# Patient Record
Sex: Male | Born: 1955 | Race: White | Hispanic: No | Marital: Married | Smoking: Former smoker
Health system: Southern US, Community
[De-identification: ages and names within clinical notes are randomized; demographics above are authoritative.]

## PROBLEM LIST (undated history)

## (undated) DIAGNOSIS — E785 Hyperlipidemia, unspecified: Secondary | ICD-10-CM

## (undated) DIAGNOSIS — S2239XA Fracture of one rib, unspecified side, initial encounter for closed fracture: Secondary | ICD-10-CM

## (undated) DIAGNOSIS — C801 Malignant (primary) neoplasm, unspecified: Secondary | ICD-10-CM

## (undated) DIAGNOSIS — K219 Gastro-esophageal reflux disease without esophagitis: Secondary | ICD-10-CM

## (undated) HISTORY — DX: Gastro-esophageal reflux disease without esophagitis: K21.9

## (undated) HISTORY — DX: Hyperlipidemia, unspecified: E78.5

## (undated) HISTORY — DX: Fracture of one rib, unspecified side, initial encounter for closed fracture: S22.39XA

---

## 1998-05-14 ENCOUNTER — Encounter: Admission: RE | Admit: 1998-05-14 | Discharge: 1998-05-14 | Payer: Self-pay | Admitting: Sports Medicine

## 2002-05-27 ENCOUNTER — Encounter: Admission: RE | Admit: 2002-05-27 | Discharge: 2002-05-27 | Payer: Self-pay | Admitting: Family Medicine

## 2002-05-27 ENCOUNTER — Encounter: Payer: Self-pay | Admitting: Family Medicine

## 2002-08-27 ENCOUNTER — Encounter: Admission: RE | Admit: 2002-08-27 | Discharge: 2002-08-27 | Payer: Self-pay | Admitting: Family Medicine

## 2002-08-27 ENCOUNTER — Encounter: Payer: Self-pay | Admitting: Family Medicine

## 2002-09-06 ENCOUNTER — Encounter: Admission: RE | Admit: 2002-09-06 | Discharge: 2002-09-06 | Payer: Self-pay | Admitting: Family Medicine

## 2002-09-06 ENCOUNTER — Encounter: Payer: Self-pay | Admitting: Family Medicine

## 2003-12-08 ENCOUNTER — Encounter: Admission: RE | Admit: 2003-12-08 | Discharge: 2003-12-08 | Payer: Self-pay | Admitting: Sports Medicine

## 2004-03-17 ENCOUNTER — Encounter: Admission: RE | Admit: 2004-03-17 | Discharge: 2004-03-17 | Payer: Self-pay | Admitting: Family Medicine

## 2004-04-17 HISTORY — PX: TOTAL KNEE ARTHROPLASTY: SHX125

## 2004-07-13 ENCOUNTER — Encounter: Admission: RE | Admit: 2004-07-13 | Discharge: 2004-07-13 | Payer: Self-pay | Admitting: Family Medicine

## 2004-08-03 ENCOUNTER — Ambulatory Visit (HOSPITAL_BASED_OUTPATIENT_CLINIC_OR_DEPARTMENT_OTHER): Admission: RE | Admit: 2004-08-03 | Discharge: 2004-08-03 | Payer: Self-pay | Admitting: Orthopedic Surgery

## 2006-04-17 LAB — HM COLONOSCOPY

## 2007-08-05 ENCOUNTER — Encounter: Admission: RE | Admit: 2007-08-05 | Discharge: 2007-08-05 | Payer: Self-pay | Admitting: Family Medicine

## 2010-09-02 NOTE — Op Note (Signed)
NAMEGARLEN, Martin Malone              ACCOUNT NO.:  [id number]   MEDICAL RECORD NO.:  [id number]          PATIENT TYPE:  AMB   LOCATION:  DSC                          FACILITY:  [facility]   PHYSICIAN:  Mila Homer. Sherlean Foot, M.D. DATE OF BIRTH:  11-27-1955   DATE OF PROCEDURE:  08/29/2004  DATE OF DISCHARGE:  08/03/2004                                 OPERATIVE REPORT   PREOPERATIVE DIAGNOSIS:  Left medial meniscal tear and arthritis.   POSTOPERATIVE DIAGNOSIS:  Left medial meniscal tear and arthritis.   OPERATION PERFORMED:  Left knee arthroscopy with partial medial meniscectomy  and chondroplasty.   SURGEON:  Mila Homer. Sherlean Foot, M.D.   ASSISTANT:  None.   ANESTHESIA:  Knee block, MAC.   INDICATIONS FOR PROCEDURE:  The patient is a 55 year old male with medial  meniscal tear and osteoarthritis of the knee.  Informed consent was  obtained.   DESCRIPTION OF PROCEDURE:  The patient was laid supine and after a knee  block, administered MAC anesthesia.  The left lower extremity was prepped  and draped in the usual sterile fashion.  Inferolateral and inferomedial  portals were created with a #11 blade, blunt trocar and cannula.  Diagnostic  arthroscopy revealed grade 2 and 3 changes in the patellofemoral joint with  fibrillation in the patellar cartilage.  A 4.2 Great White shaver was used  to perform a chondroplasty in the patellofemoral compartment.  The medial  compartment also showed some osteoarthritis and a chondroplasty was done  there.  The patient had a posterior horn medial meniscus tear which was  radial.  I used basket forceps and a Great White shaver to remove  approximately 20% of the posterior horn of the medial meniscus.  I then  visited the notch.  The ACL and PCL was normal.  I then went into the  lateral compartment and the lateral compartment was normal.  I then  irrigated the knee, closed with interrupted 4-0 nylon sutures, dressed with  Xeroform dressing sponges, sterile  Webril and an Ace wrap.   COMPLICATIONS:  None.   DRAINS:  None.      SDL/MEDQ  D:  08/29/2004  T:  08/29/2004  Job:  [id number]

## 2010-09-02 NOTE — Op Note (Signed)
Martin Malone, Martin Malone              ACCOUNT NO.:  [id number]   MEDICAL RECORD NO.:  [id number]          PATIENT TYPE:  AMB   LOCATION:  DSC                          FACILITY:  [facility]   PHYSICIAN:  Mila Homer. Sherlean Foot, M.D. DATE OF BIRTH:  [birth year]   DATE OF PROCEDURE:  08/03/2004  DATE OF DISCHARGE:                                 OPERATIVE REPORT   SURGEON:  Mila Homer. Sherlean Foot, M.D.   ASSISTANT:  None.   ANESTHESIA:  MAC.   PREOPERATIVE DIAGNOSIS:  Left knee medial meniscus tear and osteoarthritis.   POSTOPERATIVE DIAGNOSIS:  Left knee medial meniscus tear and osteoarthritis.   PROCEDURE:  Left knee arthroscopy with partial medial meniscectomy and  medial compartment chondroplasty.   INDICATIONS FOR PROCEDURE:  The patient is a 55 year old with mechanical  symptoms and MRI evidence of meniscus tear.   DESCRIPTION OF PROCEDURE:  The patient was laid supine and administered MAC  anesthesia.  The left lower extremity was prepped and draped in the usual  sterile fashion.  Inferolateral and inferomedial portals were created with a  #11 blade, blunt trocar, and cannula.  Diagnostic arthroscopy revealed grade  1 changes on the patella, 0 on the trochlea.  I then went into the lateral  compartment and there was no pathology.  I then went to the notch.  The ACL  and PCL appeared normal.  I then went into the medial compartment, there was  a 2 by 3 cm area of grade 3 chondromalacia, on the tibia, there was a 1 by 2  cm area of grade 4 chondromalacia with absolutely no cartilage.  There was  also as very complex posterior horn medial meniscus tear.  I used straight  and upbiting basket forceps as well as the 4.2 great white shaver to perform  a very aggressive posterior horn partial medial meniscectomy as well as  performing a chondroplasty on the arthritic areas.  I then lavaged the knee  and closed with interrupted 4-0 nylon sutures.  It was dressed with Adaptic,  4 by 4, sterile Webril,  and an Ace wrap.  Complications none.  Drains none.      SDL/MEDQ  D:  08/03/2004  T:  08/03/2004  Job:  [id number]

## 2011-08-02 ENCOUNTER — Telehealth: Payer: Self-pay | Admitting: Gastroenterology

## 2011-08-02 NOTE — Telephone Encounter (Signed)
The pt has an extensive history with Dr Randa Evens (last colon 2008).  I advised Dr Blair Heys office that the pt should schedule with the already established GI.  The nurse is going to let Dr Artis Flock know.

## 2011-11-21 ENCOUNTER — Encounter: Payer: Self-pay | Admitting: *Deleted

## 2011-11-28 ENCOUNTER — Encounter: Payer: Self-pay | Admitting: Cardiovascular Disease

## 2011-11-28 ENCOUNTER — Ambulatory Visit (INDEPENDENT_AMBULATORY_CARE_PROVIDER_SITE_OTHER): Payer: BC Managed Care – PPO | Admitting: Cardiovascular Disease

## 2011-11-28 VITALS — BP 118/82 | HR 72 | Ht 70.0 in | Wt 204.0 lb

## 2011-11-28 DIAGNOSIS — Z8249 Family history of ischemic heart disease and other diseases of the circulatory system: Secondary | ICD-10-CM

## 2011-11-28 DIAGNOSIS — R079 Chest pain, unspecified: Secondary | ICD-10-CM

## 2011-11-28 NOTE — Patient Instructions (Addendum)
Your physician has requested that you have an exercise tolerance test. For further information please visit [email or web address] Please also follow instruction sheet, as given.  Dr Excell Seltzer has recommended that you have a CT Calcium score performed.  Your physician wants you to follow-up in: 2 YEARS. You will receive a reminder letter in the mail two months in advance. If you don't receive a letter, please call our office to schedule the follow-up appointment.  Your physician recommends that you continue on your current medications as directed. Please refer to the Current Medication list given to you today.

## 2011-11-29 ENCOUNTER — Encounter: Payer: Self-pay | Admitting: Cardiovascular Disease

## 2011-11-29 DIAGNOSIS — R079 Chest pain, unspecified: Secondary | ICD-10-CM | POA: Insufficient documentation

## 2011-11-29 DIAGNOSIS — Z8249 Family history of ischemic heart disease and other diseases of the circulatory system: Secondary | ICD-10-CM | POA: Insufficient documentation

## 2011-11-29 NOTE — Assessment & Plan Note (Signed)
The patient has no evidence of ischemic heart disease or obvious atherosclerotic disease on exam. I think the main question is whether he has subclinical atherosclerosis, considering that both brothers had coronary events around his age. I have recommended an exercise treadmill tolerance test to rule out obstructive coronary disease, and a cardiac CT calcium score to evaluate for subclinical atherosclerosis. I think the primary intervention if he has a high calcium score would be initiation of a statin drug. I don't think that I would be inclined to start him empirically on a statin drug considering his favorable HDL cholesterol. Will use his coronary CT to further risk stratify him. If his studies are normal, I will plan on seeing him back in about 2 years for followup as I think it would be reasonable to continue a relationship considering his elevated risk of coronary disease over the next 10 years. I appreciate the opportunity to meet this nice gentleman.

## 2011-11-29 NOTE — Progress Notes (Signed)
HPI:  56 year old gentleman presenting for initial cardiac evaluation. The patient has been healthy, but he has noted a strong premature family history of coronary artery disease. Because of his family history, he was referred for cardiac evaluation.  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 He thinks that he walks about 5 miles per day. He feels well he has no symptoms with physical exertion. He specifically denies exertional chest pain or pressure. He has no dyspnea, orthopnea, PND, palpitations, edema, lightheadedness, or history of syncope. He has had episodic chest discomfort at rest that he relates to gastroesophageal reflux disease. This has responded well to over-the-counter medication.  The patient has a strong history in his family of coronary artery disease. He has a brother who was recently diagnosed with CAD and underwent coronary stenting. This occurred in his 90s. He has another brother who underwent multivessel coronary bypass surgery at about age 66. The patient has 4 sisters who have not had cardiac problems and his parents did not have cardiac problems.  He has no personal history of hypertension, diabetes, dyslipidemia, or chronic medical problems. He is a remote smoker and quit in 1979 with only a 4-pack-year history.  Recent labs demonstrated glucose of 89, uric acid 6.3, creatinine 1.1, potassium 4.3, AST 18, ALT 14, cholesterol 222, triglycerides 79, HDL 76, and LDL 130. His thyroid studies, PSA, and CBC are all within normal limits.  Outpatient Encounter Prescriptions as of 11/28/2011  Medication Sig Dispense Refill  . aspirin 81 MG tablet Take 81 mg by mouth daily.      . DUEXIS 800-26.6 MG TABS Take 1 tablet by mouth as needed.      . fish oil-omega-3 fatty acids 1000 MG capsule Take 1 g by mouth daily.      Marland Kitchen GREEN COFFEE BEAN PO Take 800 mg by mouth daily.      Glory Rosebush Cartilage 750 MG CAPS Take 1 capsule by mouth daily.         Review of patient's allergies indicates no known allergies.  Past Medical History  Diagnosis Date  . GERD (gastroesophageal reflux disease)     Past Surgical History  Procedure Date  . Total knee arthroplasty 2006    History   Social History  . Marital Status: Married    Spouse Name: N/A    Number of Children: 1  . Years of Education: N/A   Occupational History  . facility manager    Social History Main Topics  . Smoking status: Former Smoker    Quit date: 04/17/1977  . Smokeless tobacco: Not on file  . Alcohol Use: 1.2 oz/week    2 Glasses of wine per week  . Drug Use: Not on file  . Sexually Active: Not on file   Other Topics Concern  . Not on file   Social History Narrative  . No narrative on file    Family History  Problem Relation Age of Onset  . Dementia Mother   . Pancreatitis Father   . Coronary artery disease Brother     ROS: General: no fevers/chills/night sweats Eyes: no blurry vision, diplopia, or amaurosis ENT: no sore throat or hearing loss Resp: no cough, wheezing, or hemoptysis CV: no edema or palpitations GI: no abdominal pain, nausea, vomiting, diarrhea, or constipation. Positive for dyspepsia GU: no dysuria, frequency, or hematuria Skin: no rash Neuro: no headache, numbness, tingling, or weakness of extremities Musculoskeletal: Positive for chronic left  knee pain Heme: no bleeding, DVT, or easy bruising Endo: no polydipsia or polyuria  BP 118/82  Pulse 72  Ht 5\' 10"  (1.778 m)  Wt 92.534 kg (204 lb)  BMI 29.27 kg/m2  PHYSICAL EXAM: Pt is alert and oriented, WD, WN, in no distress. HEENT: normal Neck: JVP normal. Carotid upstrokes normal without bruits. No thyromegaly. Lungs: equal expansion, clear bilaterally CV: Apex is discrete and nondisplaced, RRR without murmur or gallop Abd: soft, NT, +BS, no bruit, no hepatosplenomegaly Back: no CVA tenderness Ext: no C/C/E        Femoral pulses 2+= without bruits        DP/PT  pulses intact and = Skin: warm and dry without rash Neuro: CNII-XII intact             Strength intact = bilaterally  EKG:  Normal sinus rhythm 72 beats per minute, within normal limits.  ASSESSMENT AND PLAN:

## 2011-12-05 ENCOUNTER — Ambulatory Visit (INDEPENDENT_AMBULATORY_CARE_PROVIDER_SITE_OTHER)
Admission: RE | Admit: 2011-12-05 | Discharge: 2011-12-05 | Disposition: A | Discharge status: Home / Self Care | Payer: Self-pay | Source: Ambulatory Visit | Attending: Cardiovascular Disease | Admitting: Cardiovascular Disease

## 2011-12-05 DIAGNOSIS — R079 Chest pain, unspecified: Secondary | ICD-10-CM

## 2011-12-13 ENCOUNTER — Encounter: Payer: Self-pay | Admitting: Nurse Practitioner

## 2011-12-13 ENCOUNTER — Ambulatory Visit (INDEPENDENT_AMBULATORY_CARE_PROVIDER_SITE_OTHER): Payer: BC Managed Care – PPO | Admitting: Nurse Practitioner

## 2011-12-13 DIAGNOSIS — E785 Hyperlipidemia, unspecified: Secondary | ICD-10-CM

## 2011-12-13 DIAGNOSIS — R079 Chest pain, unspecified: Secondary | ICD-10-CM

## 2011-12-13 MED ORDER — ATORVASTATIN CALCIUM 10 MG PO TABS: 10.0000 mg | ORAL_TABLET | Freq: Every day | ORAL | Status: DC

## 2011-12-13 NOTE — Procedures (Signed)
Exercise Treadmill Test  Pre-Exercise Testing Evaluation Rhythm: normal sinus  Rate: 61  PR:  .14 QRS:  .09  QT:  .38 QTc: .38     Test  Exercise Tolerance Test Ordering MD: Tonny Bollman, MD  Interpreting MD: Ward Givens , NP  Unique Test No: 1  Treadmill:  1  Indication for ETT: chest pain - rule out ischemia  Contraindication to ETT: No   Stress Modality: exercise - treadmill  Cardiac Imaging Performed: non   Protocol: standard Bruce - maximal  Max BP:  173/74  Max MPHR (bpm):  163 85% MPR (bpm):  139  MPHR obtained (bpm):  176 % MPHR obtained:  107%  Reached 85% MPHR (min:sec):  7:20 Total Exercise Time (min-sec):  10:13  Workload in METS:  12.0 Borg Scale: 17  Reason ETT Terminated:  dyspnea    ST Segment Analysis At Rest: normal ST segments - no evidence of significant ST depression With Exercise: no evidence of significant ST depression  Other Information Arrhythmia:  No Angina during ETT:  absent (0) Quality of ETT:  diagnostic  ETT Interpretation:  normal - no evidence of ischemia by ST analysis  Comments: Good exercise tolerance.  No chest pain.  No acute st/t changes.  Recommendations: Pt will f/u with Dr. Excell Seltzer in 2 yrs as planned.  I have e-Rx lipitor 10mg  qhs and arranged for f/u lipids/lft's in 8 wks in our office.

## 2011-12-13 NOTE — Patient Instructions (Addendum)
Your physician recommends that you return for lab work in: 8 weeks (lipid and liver profile) Your physician has recommended you make the following change in your medication: START Lipitor 10 mg at night

## 2011-12-13 NOTE — Addendum Note (Signed)
Addended by: Reine Just on: 12/13/2011 04:15 PM   Modules accepted: Orders

## 2012-01-08 ENCOUNTER — Telehealth: Payer: Self-pay | Admitting: Cardiovascular Disease

## 2012-01-08 MED ORDER — ATORVASTATIN CALCIUM 10 MG PO TABS: 10.0000 mg | ORAL_TABLET | Freq: Every day | ORAL | Status: DC

## 2012-01-08 NOTE — Telephone Encounter (Signed)
Pt needs 90 days supply of lipitor [facility] [address]

## 2012-01-10 ENCOUNTER — Other Ambulatory Visit: Payer: Self-pay | Admitting: Cardiovascular Disease

## 2012-01-10 ENCOUNTER — Other Ambulatory Visit: Payer: Self-pay | Admitting: Cardiology

## 2012-01-10 MED ORDER — ATORVASTATIN CALCIUM 10 MG PO TABS: 10.0000 mg | ORAL_TABLET | Freq: Every day | ORAL | Status: DC

## 2012-01-10 NOTE — Telephone Encounter (Signed)
Called in 90 day supply of atorvastatin (LIPITOR) 10 MG 1 tablet qd at bed time with 1 refill [facility] [address], [place] phone # [phone] to [facility] the pharmacist. Caralee Ates, CMA  Called Martin Malone back informing him I gave the Rx to [facility] the pharmacist, and his meditation should be ready by this evening.  Caralee Ates, CMA

## 2012-02-08 ENCOUNTER — Other Ambulatory Visit (INDEPENDENT_AMBULATORY_CARE_PROVIDER_SITE_OTHER): Payer: BC Managed Care – PPO

## 2012-02-08 DIAGNOSIS — E785 Hyperlipidemia, unspecified: Secondary | ICD-10-CM

## 2012-02-08 LAB — HEPATIC FUNCTION PANEL
ALT: 27 U/L (ref 0–53)
AST: 23 U/L (ref 0–37)
Albumin: 3.6 g/dL (ref 3.5–5.2)
Alkaline Phosphatase: 47 U/L (ref 39–117)
Bilirubin, Direct: 0.1 mg/dL (ref 0.0–0.3)
Total Bilirubin: 0.9 mg/dL (ref 0.3–1.2)
Total Protein: 7.5 g/dL (ref 6.0–8.3)

## 2012-02-08 LAB — LIPID PANEL
Cholesterol: 167 mg/dL (ref 0–200)
HDL: 63 mg/dL (ref >39.00)
LDL Cholesterol: 93 mg/dL (ref 0–99)
Total CHOL/HDL Ratio: 3
Triglycerides: 57 mg/dL (ref 0.0–149.0)
VLDL: 11.4 mg/dL (ref 0.0–40.0)

## 2012-02-15 ENCOUNTER — Telehealth: Payer: Self-pay | Admitting: Cardiovascular Disease

## 2012-02-15 NOTE — Telephone Encounter (Signed)
Reviewed results of lipid and liver with pt who was very excited about his numbers.

## 2012-02-15 NOTE — Telephone Encounter (Signed)
lmtcb

## 2012-02-15 NOTE — Telephone Encounter (Signed)
New problem:  Test results.  

## 2012-04-22 ENCOUNTER — Ambulatory Visit: Payer: BC Managed Care – PPO | Admitting: Family Medicine

## 2012-05-22 ENCOUNTER — Encounter: Payer: Self-pay | Admitting: Family Medicine

## 2012-05-22 ENCOUNTER — Ambulatory Visit (INDEPENDENT_AMBULATORY_CARE_PROVIDER_SITE_OTHER): Payer: BC Managed Care – PPO | Admitting: Family Medicine

## 2012-05-22 VITALS — BP 128/84 | HR 62 | Temp 98.1°F | Ht 70.75 in | Wt 210.2 lb

## 2012-05-22 DIAGNOSIS — Z8249 Family history of ischemic heart disease and other diseases of the circulatory system: Secondary | ICD-10-CM

## 2012-05-22 DIAGNOSIS — E785 Hyperlipidemia, unspecified: Secondary | ICD-10-CM

## 2012-05-22 NOTE — Patient Instructions (Addendum)
Schedule your complete physical for this summer Keep up the good work!  You look great! Call with any questions or concerns Welcome!  We're glad to have you!!!

## 2012-05-22 NOTE — Progress Notes (Signed)
  Subjective:    Patient ID: Martin Malone, male    DOB: [birth year], 57 y.o.   MRN: [id number]  HPI New to establish.  Previous MD- Elisabeth Cara, Last CPE 6/13, UTD on colonscopy.  Hyperlipidemia- chronic problem, on Lipitor.  Temporarily off meds due to insurance switch.  Waiting on new cards.  No abd pain, N/V, myalgias.  Exercising regularly.  Family hx of CAD- brother required angioplasty, older brother w/ triple bypass.  Saw Dr Excell Seltzer and had complete cardiac w/u w/ good results.  Only recommendation was start statin.   Review of Systems For ROS see HPI     Objective:   Physical Exam  Vitals reviewed. Constitutional: He is oriented to person, place, and time. He appears well-developed and well-nourished. No distress.  HENT:  Head: Normocephalic and atraumatic.  Eyes: Conjunctivae and EOM are normal. Pupils are equal, round, and reactive to light.  Neck: Normal range of motion. Neck supple. No thyromegaly present.  Cardiovascular: Normal rate, regular rhythm, normal heart sounds and intact distal pulses.   No murmur heard. Pulmonary/Chest: Effort normal and breath sounds normal. No respiratory distress.  Abdominal: Soft. Bowel sounds are normal. He exhibits no distension.  Musculoskeletal: He exhibits no edema.  Lymphadenopathy:    He has no cervical adenopathy.  Neurological: He is alert and oriented to person, place, and time. No cranial nerve deficit.  Skin: Skin is warm and dry.  Psychiatric: He has a normal mood and affect. His behavior is normal.          Assessment & Plan:

## 2012-05-26 NOTE — Assessment & Plan Note (Signed)
New to provider.  LDL goal is 70-100 due to family hx.  Pt plans to resume statin as soon as insurance situation is resolved.  Reviewed recent labs.  Will repeat at upcoming CPE.

## 2012-05-26 NOTE — Assessment & Plan Note (Signed)
New to provider.  Had recent cardiac workup w/ Dr Excell Seltzer.  Will continue to focus on risk reduction.

## 2012-08-23 ENCOUNTER — Other Ambulatory Visit: Payer: Self-pay | Admitting: Cardiovascular Disease

## 2012-10-21 ENCOUNTER — Ambulatory Visit: Payer: BC Managed Care – PPO | Admitting: Family Medicine

## 2012-11-19 ENCOUNTER — Encounter: Payer: Self-pay | Admitting: Family Medicine

## 2012-11-19 ENCOUNTER — Ambulatory Visit (INDEPENDENT_AMBULATORY_CARE_PROVIDER_SITE_OTHER): Payer: BC Managed Care – PPO | Admitting: Family Medicine

## 2012-11-19 VITALS — BP 130/90 | HR 62 | Temp 98.3°F | Ht 70.75 in | Wt 201.8 lb

## 2012-11-19 DIAGNOSIS — Z1331 Encounter for screening for depression: Secondary | ICD-10-CM

## 2012-11-19 DIAGNOSIS — Z0000 Encounter for general adult medical examination without abnormal findings: Secondary | ICD-10-CM | POA: Insufficient documentation

## 2012-11-19 LAB — CBC WITH DIFFERENTIAL/PLATELET
Basophils Absolute: 0 10*3/uL (ref 0.0–0.1)
Basophils Relative: 0.5 % (ref 0.0–3.0)
Eosinophils Absolute: 0.3 10*3/uL (ref 0.0–0.7)
Eosinophils Relative: 3.5 % (ref 0.0–5.0)
HCT: 42.5 % (ref 39.0–52.0)
Hemoglobin: 13.9 g/dL (ref 13.0–17.0)
Lymphocytes Relative: 25.8 % (ref 12.0–46.0)
Lymphs Abs: 2.1 10*3/uL (ref 0.7–4.0)
MCHC: 32.8 g/dL (ref 30.0–36.0)
MCV: 97.6 fl (ref 78.0–100.0)
Monocytes Absolute: 0.5 10*3/uL (ref 0.1–1.0)
Monocytes Relative: 5.6 % (ref 3.0–12.0)
Neutro Abs: 5.3 10*3/uL (ref 1.4–7.7)
Neutrophils Relative %: 64.6 % (ref 43.0–77.0)
Platelets: 239 10*3/uL (ref 150.0–400.0)
RBC: 4.36 Mil/uL (ref 4.22–5.81)
RDW: 14.2 % (ref 11.5–14.6)
WBC: 8.2 10*3/uL (ref 4.5–10.5)

## 2012-11-19 LAB — HEPATIC FUNCTION PANEL
ALT: 26 U/L (ref 0–53)
AST: 25 U/L (ref 0–37)
Albumin: 4.2 g/dL (ref 3.5–5.2)
Alkaline Phosphatase: 65 U/L (ref 39–117)
Bilirubin, Direct: 0.1 mg/dL (ref 0.0–0.3)
Total Bilirubin: 0.8 mg/dL (ref 0.3–1.2)
Total Protein: 7.1 g/dL (ref 6.0–8.3)

## 2012-11-19 LAB — BASIC METABOLIC PANEL
BUN: 15 mg/dL (ref 6–23)
CO2: 28 mEq/L (ref 19–32)
Calcium: 9.5 mg/dL (ref 8.4–10.5)
Chloride: 106 mEq/L (ref 96–112)
Creatinine, Ser: 1 mg/dL (ref 0.4–1.5)
GFR: 85.49 mL/min (ref >60.00)
Glucose, Bld: 101 mg/dL — ABNORMAL HIGH (ref 70–99)
Potassium: 4.1 mEq/L (ref 3.5–5.1)
Sodium: 139 mEq/L (ref 135–145)

## 2012-11-19 LAB — LIPID PANEL
Cholesterol: 201 mg/dL — ABNORMAL HIGH (ref 0–200)
HDL: 89.4 mg/dL (ref >39.00)
Total CHOL/HDL Ratio: 2
Triglycerides: 81 mg/dL (ref 0.0–149.0)
VLDL: 16.2 mg/dL (ref 0.0–40.0)

## 2012-11-19 LAB — PSA: PSA: 1.13 ng/mL (ref 0.10–4.00)

## 2012-11-19 LAB — TSH: TSH: 2.11 u[IU]/mL (ref 0.35–5.50)

## 2012-11-19 NOTE — Assessment & Plan Note (Signed)
Pt's PE WNL.  UTD on colonoscopy.  Check labs.  Anticipatory guidance provided.  

## 2012-11-19 NOTE — Patient Instructions (Addendum)
Follow up in 6 months to recheck lipids Keep up the good work!  You look great! We'll notify you of your lab results and make any changes if needed Call with any questions or concerns Enjoy the rest of summer!!

## 2012-11-19 NOTE — Progress Notes (Signed)
  Subjective:    Patient ID: Martin Malone, male    DOB: [birth year], 57 y.o.   MRN: [id number]  HPI CPE- pt had rib fx after falling while fishing 10/19/12.  +LOC.  Pt has been having positional dizziness.  Feels that in past 5 days this has improved 'a great deal'.  Rib pain is also improving.   Review of Systems Patient reports no vision/hearing changes, anorexia, fever ,adenopathy, persistant/recurrent hoarseness, swallowing issues, chest pain, palpitations, edema, persistant/recurrent cough, hemoptysis, dyspnea (rest,exertional, paroxysmal nocturnal), gastrointestinal  bleeding (melena, rectal bleeding), abdominal pain, excessive heart burn, GU symptoms (dysuria, hematuria, voiding/incontinence issues) syncope, focal weakness, memory loss, numbness & tingling, skin/hair/nail changes, depression, anxiety, abnormal bruising/bleeding, musculoskeletal symptoms/signs.     Objective:   Physical Exam BP 130/90  Pulse 62  Temp(Src) 98.3 F (36.8 C) (Oral)  Ht 5' 10.75" (1.797 m)  Wt 201 lb 12.8 oz (91.536 kg)  BMI 28.35 kg/m2  SpO2 97%  General Appearance:    Alert, cooperative, no distress, appears stated age  Head:    Normocephalic, without obvious abnormality, atraumatic  Eyes:    PERRL, conjunctiva/corneas clear, EOM's intact, fundi    benign, both eyes       Ears:    Normal TM's and external ear canals, both ears  Nose:   Nares normal, septum midline, mucosa normal, no drainage   or sinus tenderness  Throat:   Lips, mucosa, and tongue normal; teeth and gums normal  Neck:   Supple, symmetrical, trachea midline, no adenopathy;       thyroid:  No enlargement/tenderness/nodules  Back:     Symmetric, no curvature, ROM normal, no CVA tenderness  Lungs:     Clear to auscultation bilaterally, respirations unlabored  Chest wall:    No tenderness or deformity  Heart:    Regular rate and rhythm, S1 and S2 normal, no murmur, rub   or gallop  Abdomen:     Soft, non-tender, bowel sounds  active all four quadrants,    no masses, no organomegaly  Genitalia:    Normal male without lesion, discharge or tenderness  Rectal:    Normal tone, normal prostate, no masses or tenderness  Extremities:   Extremities normal, atraumatic, no cyanosis or edema  Pulses:   2+ and symmetric all extremities  Skin:   Skin color, texture, turgor normal, no rashes or lesions  Lymph nodes:   Cervical, supraclavicular, and axillary nodes normal  Neurologic:   CNII-XII intact. Normal strength, sensation and reflexes      throughout          Assessment & Plan:

## 2012-11-20 LAB — LDL CHOLESTEROL, DIRECT: Direct LDL: 95.1 mg/dL

## 2012-11-27 NOTE — Progress Notes (Signed)
Spoke with pt, he has already seen results via My Chart. He states he does not eat fried food or foods high in cholesterol. He added that he also walks 4-5 miles 5 days a week. Advised to repeat labs in 6 months to see if there is an improvement other wise encouraged him to continue with low fat diet and continue getting as much exercise as possible.

## 2012-12-22 IMAGING — CT CT HEART SCORING
2 series · 16 of 20 positions shown, 18 images · non-contrast
Comparison: none

***ADDENDUM*** CREATED: 12/05/2011 [DATE]

CARDIAC CTA WITH CALCIUM SCORE 12/05/2011 [DATE]
Ordering Physician: LOCKLEAR
Gong Physician: Edwin Francisco.Hiraki
PROTOCOL: The patient scanned on a Siemens sensations 16 slice
scanner.  After an initial AP and lateral topogram, 3 mm axial
slices were performed through the heart for calcium scoring.
Indications: CAD risk
DETAILED FINDINGS:
Quality of Study: Good
Coronary Calcium Score: 99 Agatston units with calcium noted in the
area of the distal left main/proximal LAD
INDICATION: Risk Factor Stratification
PROTOCOL: The patient was scanned on a Siemens Sensation 16 slice
scanner.  Noncontrast axial slices were done through the heart.
The images were analyzed on a Philips work station  Calcium scoring
was done using the Agatson Method

[Series 3: calcium score · axial · 0.40mm/px · z∈[-240,-156]mm · 8 of 38 slices shown, 10 images]
[im 5/38  vessel]
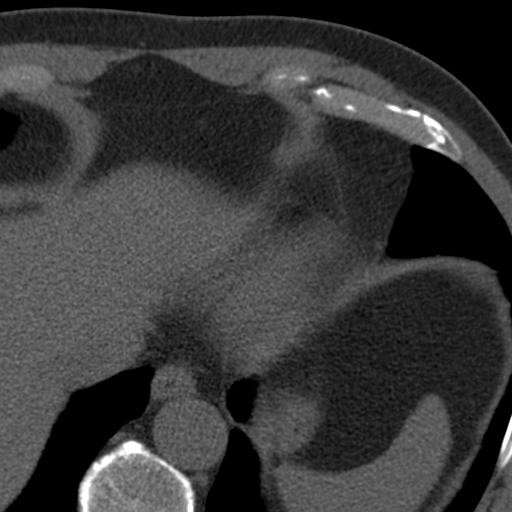
[im 5/38  lung]
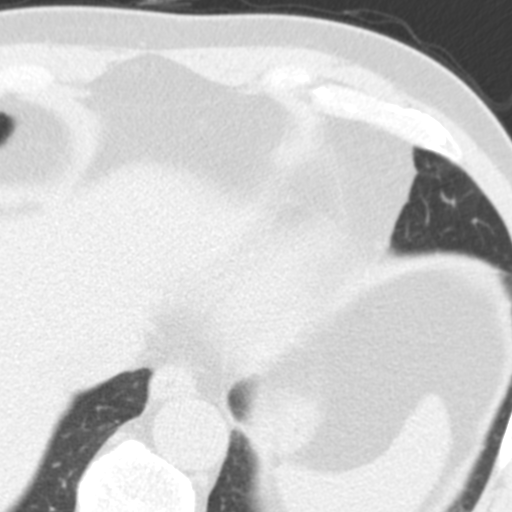
[im 9/38  vessel]
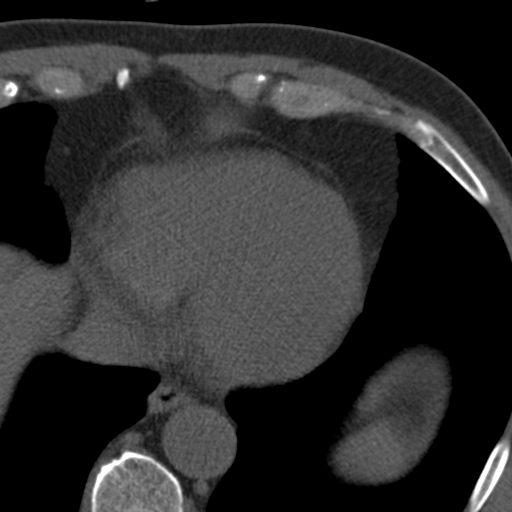
[im 13/38  vessel]
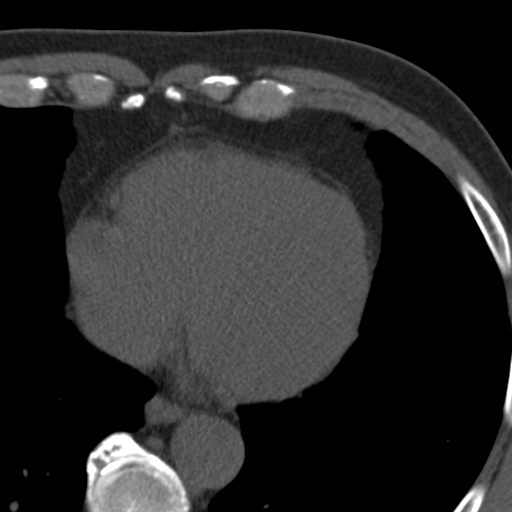
[im 17/38  vessel]
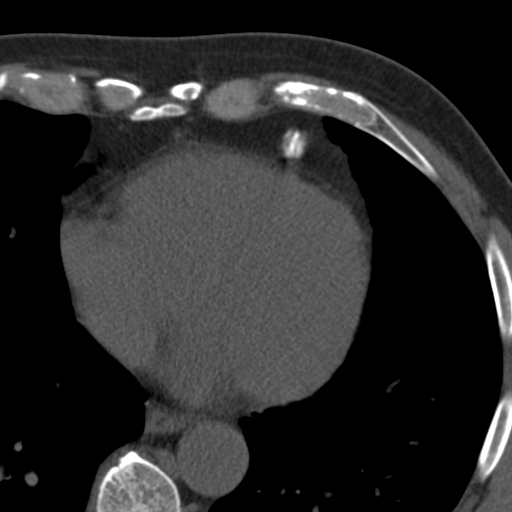
[im 21/38  vessel]
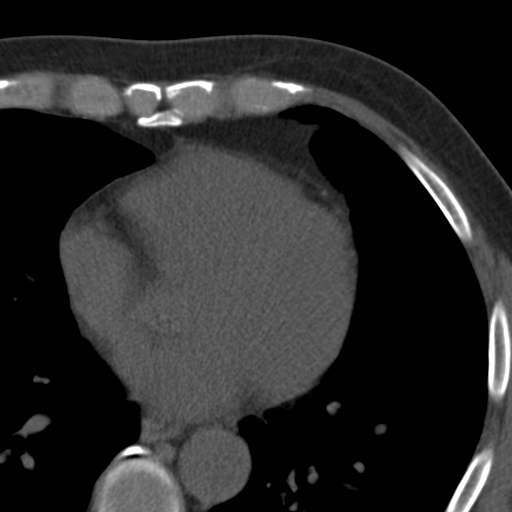
[im 21/38  lung]
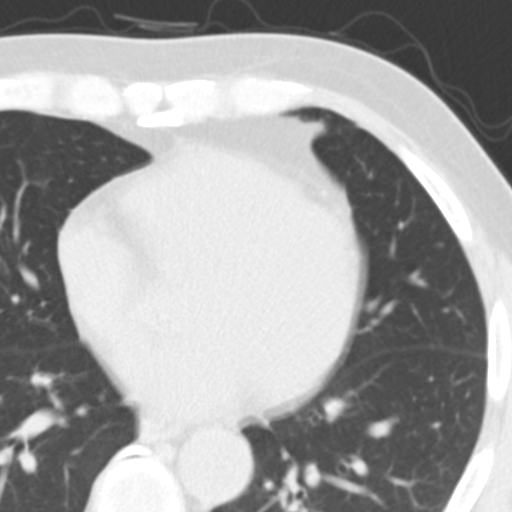
[im 25/38  vessel]
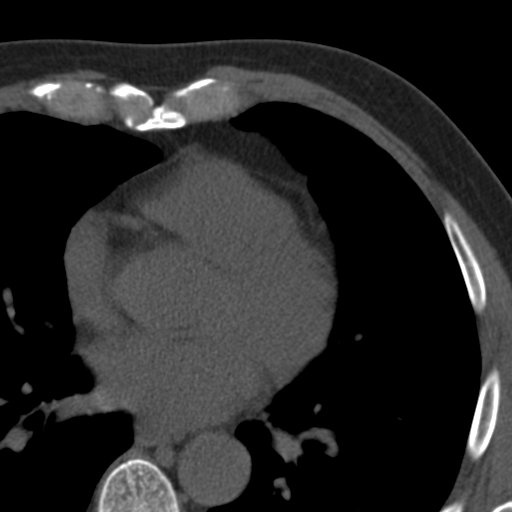
[im 29/38  vessel]
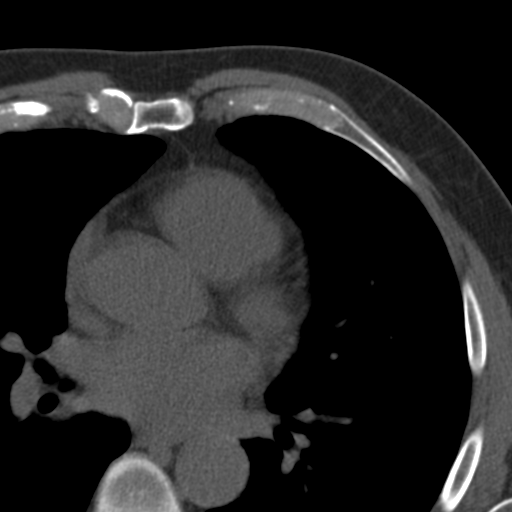
[im 33/38  vessel]
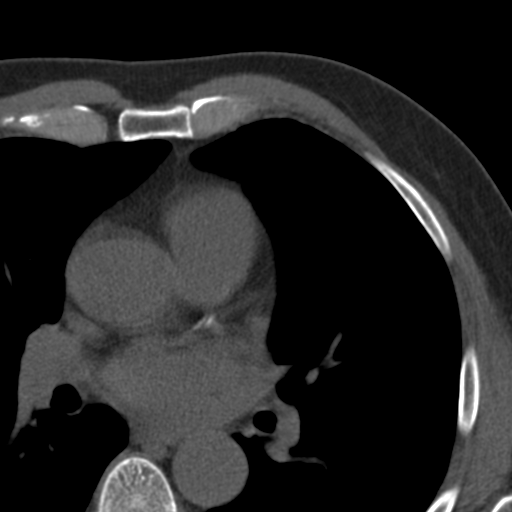

[Series 5: thins st · axial · 0.72mm/px · z∈[-236,-152]mm · 8 of 37 slices shown]
[im 5/37  vessel]
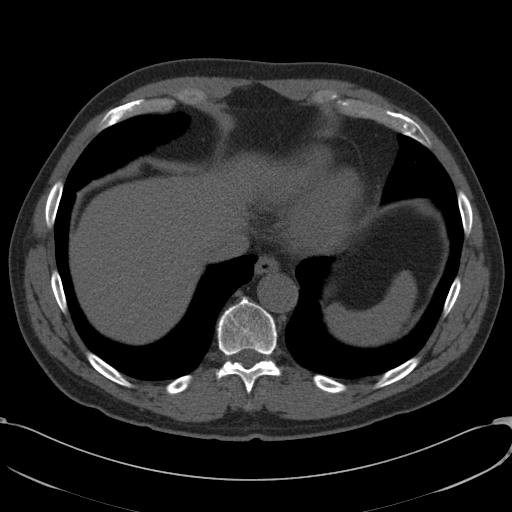
[im 9/37  vessel]
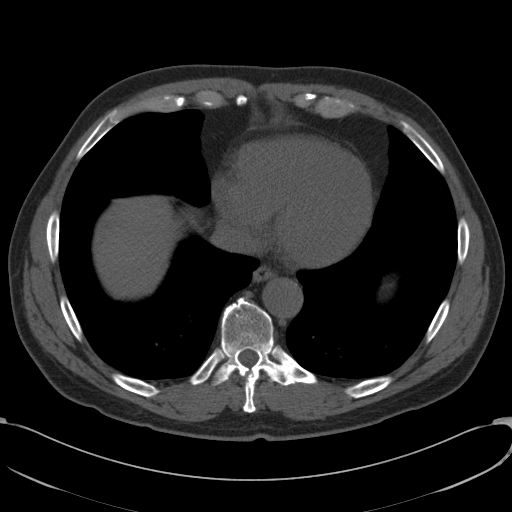
[im 13/37  vessel]
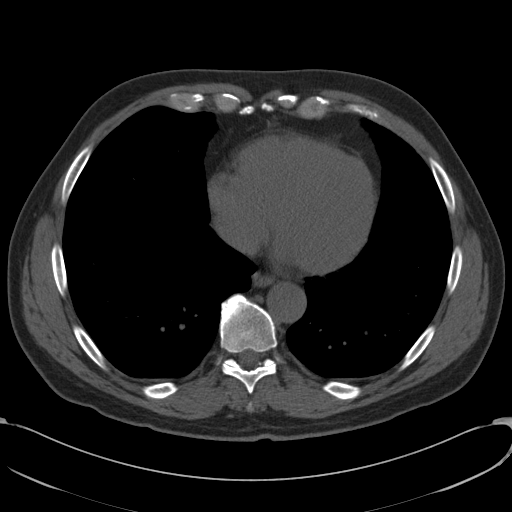
[im 17/37  vessel]
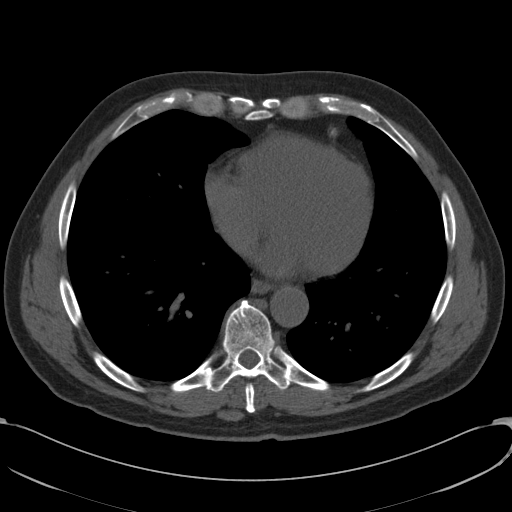
[im 21/37  vessel]
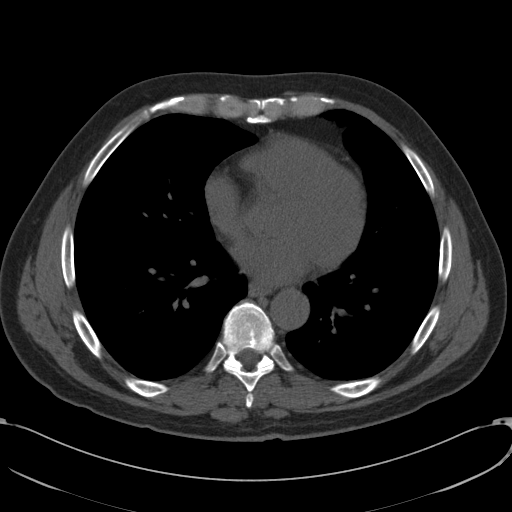
[im 25/37  vessel]
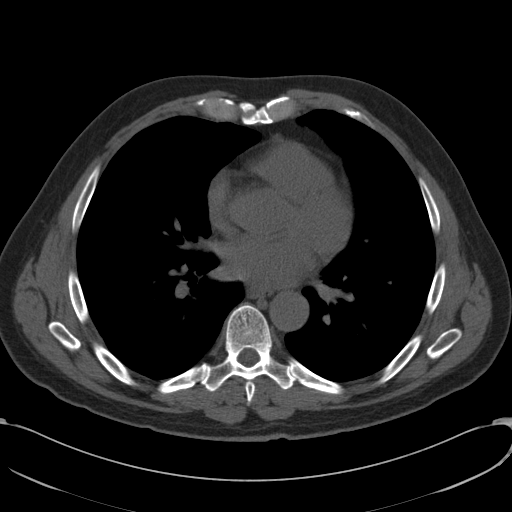
[im 29/37  vessel]
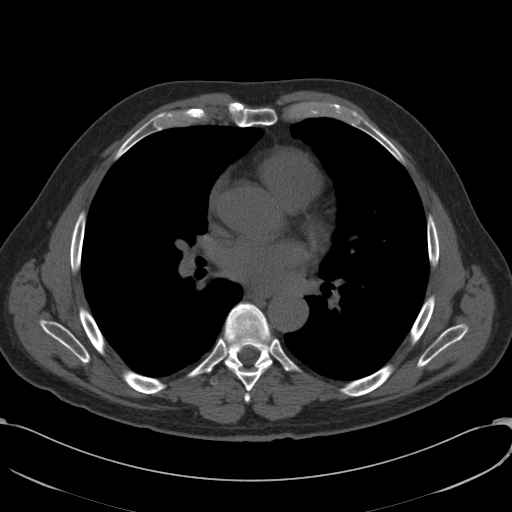
[im 33/37  vessel]
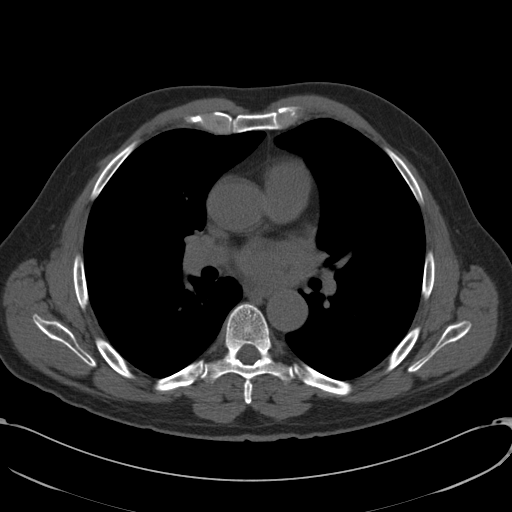

[16 of 20 positions shown; findings below may reference images not displayed]

IMPRESSION: Coronary artery calcium score of 99 Agatston units places the
patient in the 67th percentile for age and gender.  This places him
at intermediate risk of future cardiovascular events.

***END ADDENDUM*** SIGNED BY: Thoail Piumal
OVER-READ INTERPRETATION - CT CHEST

The following report is an over-read performed by radiologist Dr.
[DATE].  This over-read does not include interpretation of
cardiac or coronary anatomy or pathology.  The coronary calcium
score interpretation by the cardiologist is attached.
FINDINGS: Lung windows demonstrate no imaged airspace opacities.

Soft tissue windows demonstrate calcific density along the anterior
pericardium measures 1.1 cm and  is favored to be a lymph node
secondary to old granulomatous disease.  No pleural fluid.  No
imaged thoracic adenopathy.

Limited abdominal imaging demonstrates no significant findings.
Moderate mid thoracic spondylosis.

IMPRESSION
No acute or significant extracardiac findings within the imaged
chest.

Cardiac Calcium Score:
FINDINGS: There was an area of dense calcification involving the proximal
LAD.  Total calcium score was 99.4.  There was no calcium in the
circumflex or right coronary artery.  The pericardium was normal
The ascending aorta was upper limits of normal at 3.8 cm.  There as
an area of calcification in the epicardial fat near the RV apex.
IMPRESSION: 1)    Calcium Score 99.4  which is 66th percentile for age and sex
      matched controls All of the calcium in the proximal LAD

2)    Ascending aorta upper limits of normal 3.8 cm
3)    Calcification of the epicardial fat near the RV apex
4)    Normal pericardium

## 2013-02-20 ENCOUNTER — Other Ambulatory Visit: Payer: Self-pay

## 2013-03-01 ENCOUNTER — Other Ambulatory Visit: Payer: Self-pay | Admitting: Cardiovascular Disease

## 2013-05-21 ENCOUNTER — Encounter: Payer: Self-pay | Admitting: General Practice

## 2013-05-22 ENCOUNTER — Ambulatory Visit: Payer: BC Managed Care – PPO | Admitting: Family Medicine

## 2013-05-26 ENCOUNTER — Ambulatory Visit: Payer: BC Managed Care – PPO | Admitting: Family Medicine

## 2013-05-28 ENCOUNTER — Encounter: Payer: Self-pay | Admitting: Family Medicine

## 2013-05-28 ENCOUNTER — Ambulatory Visit (INDEPENDENT_AMBULATORY_CARE_PROVIDER_SITE_OTHER): Payer: BC Managed Care – PPO | Admitting: Family Medicine

## 2013-05-28 VITALS — BP 130/88 | HR 70 | Temp 98.2°F | Resp 16 | Wt 188.4 lb

## 2013-05-28 DIAGNOSIS — E785 Hyperlipidemia, unspecified: Secondary | ICD-10-CM

## 2013-05-28 DIAGNOSIS — Z2911 Encounter for prophylactic immunotherapy for respiratory syncytial virus (RSV): Secondary | ICD-10-CM

## 2013-05-28 DIAGNOSIS — Z23 Encounter for immunization: Secondary | ICD-10-CM

## 2013-05-28 DIAGNOSIS — H8110 Benign paroxysmal vertigo, unspecified ear: Secondary | ICD-10-CM | POA: Insufficient documentation

## 2013-05-28 LAB — HEPATIC FUNCTION PANEL
ALT: 28 U/L (ref 0–53)
AST: 31 U/L (ref 0–37)
Albumin: 4.4 g/dL (ref 3.5–5.2)
Alkaline Phosphatase: 44 U/L (ref 39–117)
Bilirubin, Direct: 0 mg/dL (ref 0.0–0.3)
Total Bilirubin: 0.8 mg/dL (ref 0.3–1.2)
Total Protein: 7.5 g/dL (ref 6.0–8.3)

## 2013-05-28 LAB — LIPID PANEL
Cholesterol: 190 mg/dL (ref 0–200)
HDL: 83.3 mg/dL (ref >39.00)
LDL Cholesterol: 93 mg/dL (ref 0–99)
Total CHOL/HDL Ratio: 2
Triglycerides: 67 mg/dL (ref 0.0–149.0)
VLDL: 13.4 mg/dL (ref 0.0–40.0)

## 2013-05-28 MED ORDER — MECLIZINE HCL 50 MG PO TABS: 50.0000 mg | ORAL_TABLET | Freq: Three times a day (TID) | ORAL | Status: DC | PRN

## 2013-05-28 NOTE — Assessment & Plan Note (Signed)
Chronic problem.  Check labs.  Adjust meds prn  

## 2013-05-28 NOTE — Patient Instructions (Signed)
Schedule your complete physical in 6 months We'll notify you of your lab results and make any changes if needed Keep up the good work! Continue the Aspirin daily Take the Meclizine as needed for the dizziness Try the exercises on the handout to desensitize your ears Call with any questions or concerns Happy Valentine's Day!

## 2013-05-28 NOTE — Assessment & Plan Note (Signed)
New.  Discussed dx and tx w/ pt.  Start meclizine prn.  Pt given handout for modified Eppley maneuver.  Reviewed supportive care and red flags that should prompt return.  Pt expressed understanding and is in agreement w/ plan.

## 2013-05-28 NOTE — Progress Notes (Signed)
   Subjective:    Patient ID: Martin Malone, male    DOB: 12/02/1955, 58 y.o.   MRN: [id number]  HPI Hyperlipidemia- on Lipitor.  Denies abd pain, N/V, myalgias, CP, SOB, HAs.  Dizziness- occuring intermittently.  Completely resolved for some time but then returned 2 weeks ago.  Worst w/ lying down and rolling over.  Will also have sxs w/ looking up for any period of time and then bringing head down to normal position.  sxs when lying are described as a spinning.  When standing, described as a 'woozy'.   Review of Systems For ROS see HPI     Objective:   Physical Exam  Vitals reviewed. Constitutional: He is oriented to person, place, and time. He appears well-developed and well-nourished. No distress.  HENT:  Head: Normocephalic and atraumatic.  Nose: Nose normal.  Mouth/Throat: Oropharynx is clear and moist.  TMs WNL bilaterally  Eyes: Conjunctivae and EOM are normal. Pupils are equal, round, and reactive to light.  2 beats of vertical nystagmus w/ upward gaze  Neck: Normal range of motion. Neck supple. No thyromegaly present.  Cardiovascular: Normal rate, regular rhythm, normal heart sounds and intact distal pulses.   No murmur heard. Pulmonary/Chest: Effort normal and breath sounds normal. No respiratory distress.  Abdominal: Soft. Bowel sounds are normal. He exhibits no distension.  Musculoskeletal: He exhibits no edema.  Lymphadenopathy:    He has no cervical adenopathy.  Neurological: He is alert and oriented to person, place, and time. No cranial nerve deficit.  Skin: Skin is warm and dry.  Psychiatric: He has a normal mood and affect. His behavior is normal.          Assessment & Plan:

## 2013-05-28 NOTE — Progress Notes (Signed)
Pre visit review using our clinic review tool, if applicable. No additional management support is needed unless otherwise documented below in the visit note. 

## 2013-05-28 NOTE — Addendum Note (Signed)
Addended by: Kris Hartmann on: 05/28/2013 02:51 PM   Modules accepted: Orders

## 2013-06-09 ENCOUNTER — Other Ambulatory Visit: Payer: Self-pay | Admitting: Cardiovascular Disease

## 2013-07-11 ENCOUNTER — Encounter: Payer: Self-pay | Admitting: Family Medicine

## 2013-07-11 ENCOUNTER — Ambulatory Visit (INDEPENDENT_AMBULATORY_CARE_PROVIDER_SITE_OTHER): Payer: BC Managed Care – PPO | Admitting: Family Medicine

## 2013-07-11 VITALS — BP 124/84 | HR 78 | Temp 98.3°F | Resp 16 | Wt 185.5 lb

## 2013-07-11 DIAGNOSIS — M25579 Pain in unspecified ankle and joints of unspecified foot: Secondary | ICD-10-CM | POA: Insufficient documentation

## 2013-07-11 MED ORDER — MELOXICAM 15 MG PO TABS: 15.0000 mg | ORAL_TABLET | Freq: Every day | ORAL | Status: DC

## 2013-07-11 NOTE — Progress Notes (Signed)
   Subjective:    Patient ID: Martin Malone, male    DOB: 06-Mar-1956, 58 y.o.   MRN: [id number]  HPI R ankle pain- sxs started 3 weeks ago.  No known injury.  Running 3.5 miles, walking 2 miles every other day.  Pain is radiating up into calf.  Ibuprofen and icy hot w/ some relief.  Now having pain behind lateral malleolus.  Pain w/ plantar flexion.  Wearing ankle brace for support.  No pain this AM on run.  No redness or swelling.  No longer radiating up into calf.   Review of Systems For ROS see HPI     Objective:   Physical Exam  Constitutional: He is oriented to person, place, and time. He appears well-developed and well-nourished. No distress.  Cardiovascular: Intact distal pulses.   Musculoskeletal: He exhibits tenderness (over peroneal tendon just posterior to R lateral malleolus). He exhibits no edema.  No redness, edema of R ankle Full ROM of R ankle  Neurological: He is alert and oriented to person, place, and time. He has normal reflexes. No cranial nerve deficit. Coordination normal.  Skin: Skin is warm and dry.          Assessment & Plan:

## 2013-07-11 NOTE — Progress Notes (Signed)
Pre visit review using our clinic review tool, if applicable. No additional management support is needed unless otherwise documented below in the visit note. 

## 2013-07-11 NOTE — Assessment & Plan Note (Signed)
New.  Start daily NSAIDs.  Ice.  Refer to sports med.  Suspect tendonitis.  Will follow.

## 2013-07-11 NOTE — Patient Instructions (Signed)
We'll call you with your Sports Med appt Start the Meloxicam daily ICE! Call with any questions or concerns Hang in there!!!

## 2013-07-15 ENCOUNTER — Ambulatory Visit (INDEPENDENT_AMBULATORY_CARE_PROVIDER_SITE_OTHER): Payer: BC Managed Care – PPO | Admitting: Family Medicine

## 2013-07-15 ENCOUNTER — Encounter: Payer: Self-pay | Admitting: Family Medicine

## 2013-07-15 ENCOUNTER — Other Ambulatory Visit (INDEPENDENT_AMBULATORY_CARE_PROVIDER_SITE_OTHER): Payer: BC Managed Care – PPO

## 2013-07-15 VITALS — BP 136/84 | HR 53 | Ht 70.0 in | Wt 187.0 lb

## 2013-07-15 DIAGNOSIS — Z8739 Personal history of other diseases of the musculoskeletal system and connective tissue: Secondary | ICD-10-CM

## 2013-07-15 DIAGNOSIS — M7750 Other enthesopathy of unspecified foot: Secondary | ICD-10-CM

## 2013-07-15 DIAGNOSIS — Z87828 Personal history of other (healed) physical injury and trauma: Secondary | ICD-10-CM | POA: Insufficient documentation

## 2013-07-15 DIAGNOSIS — M25571 Pain in right ankle and joints of right foot: Secondary | ICD-10-CM

## 2013-07-15 DIAGNOSIS — M25579 Pain in unspecified ankle and joints of unspecified foot: Secondary | ICD-10-CM

## 2013-07-15 DIAGNOSIS — M7671 Peroneal tendinitis, right leg: Secondary | ICD-10-CM | POA: Insufficient documentation

## 2013-07-15 NOTE — Patient Instructions (Signed)
Nice to meet you Try exercises 4 times a week for ankle  Cross train with running for now limiting to only 2 times a week. Biking would be great For your knee avoid deep squats or jumping.  Look into [employer] and x-ankle brace size medium.  Ice bath 20 minutes 2 times a day.  Meloxicam daily for 10 days then as needed. .  Glucosamine sulfate 750mg  twice a day is a supplement that has been shown to help moderate to severe arthritis. Vitamin D 2000 IU daily is crucial! Fish oil 2 grams daily.  Tumeric 500mg  twice daily.  Capsaicin topically up to four times a day may also help with pain. For your wife tell her to try to leave other insole in shoe and see if that helps.  If not then come back and let me look at them.  Come back in 4 weeks to make sure you are improving.

## 2013-07-15 NOTE — Assessment & Plan Note (Signed)
Patient did discuss how his left knee was bothering him out a portion. Patient was given a home exercise program to try first and we'll further evaluate in greater detail if he continues to have pain at next visit.

## 2013-07-15 NOTE — Assessment & Plan Note (Signed)
Patient's main problem is likely peroneal tendinitis with a small tear as well as the stress reaction of the lateral malleolus. Patient is wearing good neutrals shoes I think will be beneficial. He lists were given today. Patient will do ice baths and we discussed anti-inflammatories and over-the-counter medications I will be beneficial. Patient given home exercise program to try as well. Patient does not make any significant improvement he will come back in 3-4 weeks for further evaluation. He also discussed bracing they can get over-the-counter as well with compression sleeves.

## 2013-07-15 NOTE — Progress Notes (Signed)
[facility] [place], [place] [address] Phone: [phone] Subjective:    I'm seeing this patient by the request  of:  Annye Asa, MD   CC: Right ankle pain  Martin Malone is a 58 y.o. male coming in with complaint of right ankle pain.  This started 3 weeks ago after doing some running. Patient states the pain service on the lateral aspect ankle and did have some swelling medially. Patient has tried his meloxicam from his wife which has been beneficial. Patient did go see his primary care provider who gave him his own meloxicam and now he states that it does not seem to be making significant improvement. Patient states he doesn't notice it with regular walking but when he tries to run he has discomfort on the lateral aspect of the ankle in points just inferior and posterior to the lateral malleolus. Patient denies any true injury previously. He rates the severity is 3/10.  Patient states that the pain is radiating up his calf that time. Patient states that the pain is worse when he plantar flexes the foot. Pain is mostly on the lateral aspect of the ankle. Patient has been trying to wear her ankle brace for support. Patient denies any redness or swelling denies any radiation to his foot.     Past medical history, social, surgical and family history all reviewed in electronic medical record.   Review of Systems: No headache, visual changes, nausea, vomiting, diarrhea, constipation, dizziness, abdominal pain, skin rash, fevers, chills, night sweats, weight loss, swollen lymph nodes, body aches, joint swelling, muscle aches, chest pain, shortness of breath, mood changes.   Objective Blood pressure 136/84, pulse 53, height 5\' 10"  (1.778 m), weight 187 lb (84.823 kg), SpO2 97.00%.  General: No apparent distress alert and oriented x3 mood and affect normal, dressed appropriately.  HEENT: Pupils equal, extraocular movements intact    Respiratory: Patient's speak in full sentences and does not appear short of breath  Cardiovascular: No lower extremity edema, non tender, no erythema  Skin: Warm dry intact with no signs of infection or rash on extremities or on axial skeleton.  Abdomen: Soft nontender  Neuro: Cranial nerves II through XII are intact, neurovascularly intact in all extremities with 2+ DTRs and 2+ pulses.  Lymph: No lymphadenopathy of posterior or anterior cervical chain or axillae bilaterally.  Gait normal with good balance and coordination.  MSK:  Non tender with full range of motion and good stability and symmetric strength and tone of shoulders, elbows, wrist, hip, knees bilaterally.  Ankle: Right No visible erythema or swelling. Range of motion is full in all directions. Strength is 5/5 in all directions. Stable lateral and medial ligaments; squeeze test and kleiger test unremarkable; Talar dome nontender; No pain at base of 5th MT; No tenderness over cuboid; No tenderness over N spot or navicular prominence No tenderness on posterior aspects of lateral and medial malleolus Tenderness to the peroneal just inferior and posterior to the malleolus Negative tarsal tunnel tinel's Able to walk 4 steps.  MSK US performed of: Right ankle This study was ordered, performed, and interpreted by Charlann Boxer D.O.  Foot/Ankle:   All structures visualized.   Talar dome unremarkable  Ankle mortise without effusion. Peroneus longus and brevis tendons does not have significant effusion the patient does have a tear in this area. Increasing Doppler flow noted just superior over the lateral malleolus Posterior tibialis, flexor hallucis longus, and flexor digitorum  longus tendons unremarkable on long and transverse views without sheath effusions. Achilles tendon visualized along length of tendon and unremarkable on long and transverse views without sheath effusion. Anterior Talofibular Ligament and Calcaneofibular  Ligaments unremarkable and intact. Deltoid Ligament unremarkable and intact. Plantar fascia intact and without effusion, normal thickness. No increased doppler signal, cap sign, or thickening of tibial cortex. Power doppler signal normal.  IMPRESSION: Peroneal tendinitis with stress reaction of the inferior lateral malleolus.     Impression and Recommendations:     This case required medical decision making of moderate complexity.

## 2013-08-13 ENCOUNTER — Encounter: Payer: Self-pay | Admitting: Family Medicine

## 2013-08-13 ENCOUNTER — Ambulatory Visit (INDEPENDENT_AMBULATORY_CARE_PROVIDER_SITE_OTHER): Payer: BC Managed Care – PPO | Admitting: Family Medicine

## 2013-08-13 VITALS — BP 116/80 | HR 87 | Wt 186.0 lb

## 2013-08-13 DIAGNOSIS — M7671 Peroneal tendinitis, right leg: Secondary | ICD-10-CM

## 2013-08-13 DIAGNOSIS — M7750 Other enthesopathy of unspecified foot: Secondary | ICD-10-CM

## 2013-08-13 NOTE — Assessment & Plan Note (Signed)
Discussed with patient at great length today. We show patient proper running stance and working on hip abductor strengthening. Patient will continue with the exercises 3 times a week for another 6 weeks. We discussed running shoes and held patient is doing well with this. Patient will do icing after long activity. Patient can follow up in 4-6 weeks if not completely resolved. Otherwise patient will follow up on an as-needed basis.  Spent greater than 25 minutes with patient face-to-face and had greater than 50% of counseling including as described above in assessment and plan.

## 2013-08-13 NOTE — Patient Instructions (Signed)
Good to see you Exercises 3 times a week for at least 6 Ice at end of runs when you need it.  You are doing great.  Try line drills with large toe each side skimming.  See me when you need me.

## 2013-08-13 NOTE — Progress Notes (Signed)
  [facility] [place], [place] [address] Phone: [phone] Subjective:    CC: Right ankle pain follow up  XTG:GYIRSWNIOE Martin Malone is a 58 y.o. male coming in with complaint of right ankle pain previously and is here for followup. Patient was seen previously and was diagnosed with a peroneal tendinitis of the right large hemi-. Patient states he's been doing the exercises and did get different shoes. Patient states that the pain is almost completely resolved. Patient states only if he does significant amount walking does he have some mild discomfort but no swelling and does not stop him from any activity. Patient has not taken any medications for the pain a regular basis. Overall very happy with the results.    Past medical history, social, surgical and family history all reviewed in electronic medical record.   Review of Systems: No headache, visual changes, nausea, vomiting, diarrhea, constipation, dizziness, abdominal pain, skin rash, fevers, chills, night sweats, weight loss, swollen lymph nodes, body aches, joint swelling, muscle aches, chest pain, shortness of breath, mood changes.   Objective Blood pressure 116/80, pulse 87, weight 186 lb (84.369 kg), SpO2 97.00%.  General: No apparent distress alert and oriented x3 mood and affect normal, dressed appropriately.  HEENT: Pupils equal, extraocular movements intact  Respiratory: Patient's speak in full sentences and does not appear short of breath  Cardiovascular: No lower extremity edema, non tender, no erythema  Skin: Warm dry intact with no signs of infection or rash on extremities or on axial skeleton.  Abdomen: Soft nontender  Neuro: Cranial nerves II through XII are intact, neurovascularly intact in all extremities with 2+ DTRs and 2+ pulses.  Lymph: No lymphadenopathy of posterior or anterior cervical chain or axillae bilaterally.  Gait normal with good balance and coordination.    MSK:  Non tender with full range of motion and good stability and symmetric strength and tone of shoulders, elbows, wrist, hip, knees bilaterally.  Ankle: Right No visible erythema or swelling. Range of motion is full in all directions. Strength is 5/5 in all directions. Stable lateral and medial ligaments; squeeze test and kleiger test unremarkable; Talar dome nontender; No pain at base of 5th MT; No tenderness over cuboid; No tenderness over N spot or navicular prominence No tenderness on posterior aspects of lateral and medial malleolus To tenderness over the peroneal tendons Negative tarsal tunnel tinel's Able to walk 4 steps.  Running gait analysis shows the patient does have mild external rotation with supination of the right foot.    Impression and Recommendations:     This case required medical decision making of moderate complexity.

## 2013-09-03 ENCOUNTER — Other Ambulatory Visit: Payer: Self-pay | Admitting: Cardiovascular Disease

## 2013-11-06 ENCOUNTER — Ambulatory Visit (INDEPENDENT_AMBULATORY_CARE_PROVIDER_SITE_OTHER): Payer: BC Managed Care – PPO | Admitting: Family Medicine

## 2013-11-06 ENCOUNTER — Encounter: Payer: Self-pay | Admitting: Family Medicine

## 2013-11-06 VITALS — BP 130/84 | HR 68 | Temp 97.9°F | Resp 16 | Wt 191.0 lb

## 2013-11-06 DIAGNOSIS — S76319A Strain of muscle, fascia and tendon of the posterior muscle group at thigh level, unspecified thigh, initial encounter: Secondary | ICD-10-CM | POA: Insufficient documentation

## 2013-11-06 DIAGNOSIS — R21 Rash and other nonspecific skin eruption: Secondary | ICD-10-CM | POA: Insufficient documentation

## 2013-11-06 DIAGNOSIS — IMO0002 Reserved for concepts with insufficient information to code with codable children: Secondary | ICD-10-CM

## 2013-11-06 DIAGNOSIS — S76311A Strain of muscle, fascia and tendon of the posterior muscle group at thigh level, right thigh, initial encounter: Secondary | ICD-10-CM

## 2013-11-06 MED ORDER — CYCLOBENZAPRINE HCL 10 MG PO TABS
10.0000 mg | ORAL_TABLET | Freq: Three times a day (TID) | ORAL | Status: DC | PRN
Start: 2013-11-06 — End: 2013-11-18

## 2013-11-06 MED ORDER — TRIAMCINOLONE ACETONIDE 0.1 % EX OINT: 1.0000 "application " | TOPICAL_OINTMENT | Freq: Two times a day (BID) | CUTANEOUS | Status: DC

## 2013-11-06 NOTE — Patient Instructions (Signed)
Follow up as scheduled for your physical Use the Triamcinolone ointment twice daily on your rash Take the Flexeril at night for the hamstring strain Continue the ibuprofen, ice, heat (before stretching) Call with any questions or concerns Hang in there! Happy [birth year] [employer]!!!

## 2013-11-06 NOTE — Progress Notes (Signed)
Pre visit review using our clinic review tool, if applicable. No additional management support is needed unless otherwise documented below in the visit note. 

## 2013-11-06 NOTE — Progress Notes (Signed)
   Subjective:    Patient ID: Martin Malone, male    DOB: 1955-12-04, 58 y.o.   MRN: [id number]  HPI Tailbone pain- sxs started 6-7 weeks ago. Pain will radiate down leg 'into hamstring and radiate around to my hip'.  No relief w/ Mobic.  Some improvement w/ ibuprofen and ice.  Pain is almost completely gone w/ Salon pas patches.  Rash- on chest and abdomen, appeared after pt was on run.  Very itchy.  Improves w/ hydrocortisone  Review of Systems For ROS see HPI     Objective:   Physical Exam  Vitals reviewed. Constitutional: He is oriented to person, place, and time. He appears well-developed and well-nourished. No distress.  Cardiovascular: Intact distal pulses.   Musculoskeletal:  Full ROM of R hip Good flexion/extension of spine + TTP over either ischial bursa or insertion of biceps femoris (hamstring)  Neurological: He is alert and oriented to person, place, and time.  Skin: Skin is warm and dry. Rash (macular papular rash w/ scattered vesicles on trunk) noted.          Assessment & Plan:

## 2013-11-07 NOTE — Assessment & Plan Note (Signed)
New.  Appears to be moderate to severe contact dermatitis.  Start topical steroid ointment.  Reviewed supportive care and red flags that should prompt return.  Pt expressed understanding and is in agreement w/ plan.

## 2013-11-07 NOTE — Assessment & Plan Note (Signed)
New.  Pt w/ either ischial bursitis or hamstring strain of insertion of biceps femoris.  Scheduled NSAIDs, alternate ice/heat, stretching as needed.  Reviewed supportive care and red flags that should prompt return.  Pt expressed understanding and is in agreement w/ plan.

## 2013-11-17 ENCOUNTER — Telehealth: Payer: Self-pay | Admitting: Family Medicine

## 2013-11-17 NOTE — Telephone Encounter (Signed)
Give 5 more days flexeril. Then if symptoms persist then return.

## 2013-11-17 NOTE — Telephone Encounter (Signed)
Please advise pt was given #45 on 11/06/13.

## 2013-11-17 NOTE — Telephone Encounter (Signed)
Caller name: Alric  Call back number: [phone]   Reason for call:  Pt is still having discomfort in area he was seen for on 7/22.  Pt is wanting some more of the Rx cyclobenzaprine (FLEXERIL) 10 MG tablet

## 2013-11-18 MED ORDER — CYCLOBENZAPRINE HCL 10 MG PO TABS: 10.0000 mg | ORAL_TABLET | Freq: Three times a day (TID) | ORAL | Status: DC | PRN

## 2013-11-18 NOTE — Telephone Encounter (Signed)
Med filled #15 and pt notified of edward's notes.

## 2013-11-21 ENCOUNTER — Encounter: Payer: Self-pay | Admitting: Cardiovascular Disease

## 2013-11-21 ENCOUNTER — Ambulatory Visit (INDEPENDENT_AMBULATORY_CARE_PROVIDER_SITE_OTHER): Payer: BC Managed Care – PPO | Admitting: Cardiovascular Disease

## 2013-11-21 VITALS — BP 122/84 | HR 86 | Ht 69.0 in | Wt 188.0 lb

## 2013-11-21 DIAGNOSIS — Z8249 Family history of ischemic heart disease and other diseases of the circulatory system: Secondary | ICD-10-CM

## 2013-11-21 DIAGNOSIS — E782 Mixed hyperlipidemia: Secondary | ICD-10-CM

## 2013-11-21 NOTE — Patient Instructions (Signed)
Your physician wants you to follow-up in: 2 YEARS with Dr Burt Knack.  You will receive a reminder letter in the mail two months in advance. If you don't receive a letter, please call our office to schedule the follow-up appointment.  Your physician recommends that you continue on your current medications as directed. Please refer to the Current Medication list given to you today.

## 2013-11-21 NOTE — Progress Notes (Signed)
    HPI:  58 year old gentleman presenting for cardiac followup. The patient was initially seen in 2013 because of a strong family history of coronary artery disease. He has a brother who initially had CAD diagnosed in his 14s and required coronary stenting. Another brother required multivessel CABG at age 3. Last lipids from February 2015 showed cholesterol 190, LDL 93, and HDL 83. His triglycerides were 67. The patient had a coronary calcium score in 2013 that placed him at the 67th percentile compared to age-matched cohort. The patient was started on atorvastatin at that time. He has tolerated this well over the past few years. He exercises regularly and does a great deal of talking and walking. He jogged about 3 miles this morning and walked another 4 miles. This is pretty typical for him. He has no symptoms with exertion. He specifically denies chest pain, chest pressure, shortness of breath, lightheadedness, or heart palpitations.  Outpatient Encounter Prescriptions as of 11/21/2013  Medication Sig  . aspirin 81 MG tablet Take 81 mg by mouth daily.  Marland Kitchen atorvastatin (LIPITOR) 10 MG tablet TAKE 1 TABLET BY MOUTH EVERY DAY AT BEDTIME  . Cholecalciferol 1000 UNITS capsule Take 2,000 Units by mouth daily.  . cyclobenzaprine (FLEXERIL) 10 MG tablet Take 1 tablet (10 mg total) by mouth 3 (three) times daily as needed for muscle spasms.  . fish oil-omega-3 fatty acids 1000 MG capsule Take 1 g by mouth daily.  Marland Kitchen ibuprofen (ADVIL,MOTRIN) 200 MG tablet Take 600 mg by mouth daily as needed.   Marland Kitchen KRILL OIL PO Take 2 tablets by mouth daily.  Marland Kitchen triamcinolone ointment (KENALOG) 0.1 % Apply 1 application topically 2 (two) times daily.  . Turmeric 500 MG CAPS Take 2 tablets by mouth daily.  . [DISCONTINUED] HYDROcodone-acetaminophen (NORCO/VICODIN) 5-325 MG per tablet Take 1 tablet by mouth every 4 (four) hours as needed.   . [DISCONTINUED] meloxicam (MOBIC) 15 MG tablet Take 15 mg by mouth daily.     No Known  Allergies  Past Medical History  Diagnosis Date  . GERD (gastroesophageal reflux disease)   . Rib fracture   . Hyperlipidemia    BP 122/84  Pulse 86  Ht 5\' 9"  (1.753 m)  Wt 188 lb (85.276 kg)  BMI 27.75 kg/m2  PHYSICAL EXAM: Pt is alert and oriented, physically fit appearing male in NAD HEENT: normal Neck: JVP - normal, carotids 2+= without bruits Lungs: CTA bilaterally CV: RRR without murmur or gallop Abd: soft, NT, Positive BS, no hepatomegaly Ext: no C/C/E, distal pulses intact and equal Skin: warm/dry no rash  EKG:  Normal sinus rhythm, within normal limits.  ASSESSMENT AND PLAN: 1. Hyperlipidemia. The patient remains on atorvastatin. Most recent lipids as below: Lipid Panel     Component Value Date/Time   CHOL 190 05/28/2013 1219   TRIG 67.0 05/28/2013 1219   HDL 83.30 05/28/2013 1219   CHOLHDL 2 05/28/2013 1219   VLDL 13.4 05/28/2013 1219   LDLCALC 93 05/28/2013 1219   2. Family history of premature CAD. Patient with higher than normal coronary calcium score. He is on a good risk reduction program with aspirin and atorvastatin. His blood pressure is well controlled. He is completely asymptomatic at a good workload and will continue with his exercise program. I will see him back in 2 years for followup unless problems arise. He understands to contact us immediately if anginal symptoms occur.  Sherren Mocha MD 11/21/2013 3:21 PM

## 2013-11-26 ENCOUNTER — Ambulatory Visit (INDEPENDENT_AMBULATORY_CARE_PROVIDER_SITE_OTHER): Payer: BC Managed Care – PPO | Admitting: Family Medicine

## 2013-11-26 ENCOUNTER — Encounter: Payer: Self-pay | Admitting: General Practice

## 2013-11-26 ENCOUNTER — Encounter: Payer: Self-pay | Admitting: Family Medicine

## 2013-11-26 VITALS — BP 140/80 | HR 69 | Temp 98.0°F | Resp 16 | Ht 70.0 in | Wt 188.2 lb

## 2013-11-26 DIAGNOSIS — Z0000 Encounter for general adult medical examination without abnormal findings: Secondary | ICD-10-CM

## 2013-11-26 LAB — PSA: PSA: 1.32 ng/mL (ref 0.10–4.00)

## 2013-11-26 LAB — CBC WITH DIFFERENTIAL/PLATELET
Basophils Absolute: 0 10*3/uL (ref 0.0–0.1)
Basophils Relative: 0.7 % (ref 0.0–3.0)
Eosinophils Absolute: 0.1 10*3/uL (ref 0.0–0.7)
Eosinophils Relative: 1.7 % (ref 0.0–5.0)
HCT: 37.1 % — ABNORMAL LOW (ref 39.0–52.0)
Hemoglobin: 12.4 g/dL — ABNORMAL LOW (ref 13.0–17.0)
Lymphocytes Relative: 31.8 % (ref 12.0–46.0)
Lymphs Abs: 2.3 10*3/uL (ref 0.7–4.0)
MCHC: 33.6 g/dL (ref 30.0–36.0)
MCV: 96.4 fl (ref 78.0–100.0)
Monocytes Absolute: 0.5 10*3/uL (ref 0.1–1.0)
Monocytes Relative: 6.8 % (ref 3.0–12.0)
Neutro Abs: 4.2 10*3/uL (ref 1.4–7.7)
Neutrophils Relative %: 59 % (ref 43.0–77.0)
Platelets: 259 10*3/uL (ref 150.0–400.0)
RBC: 3.84 Mil/uL — ABNORMAL LOW (ref 4.22–5.81)
RDW: 14.6 % (ref 11.5–15.5)
WBC: 7.2 10*3/uL (ref 4.0–10.5)

## 2013-11-26 LAB — BASIC METABOLIC PANEL
BUN: 22 mg/dL (ref 6–23)
CO2: 24 mEq/L (ref 19–32)
Calcium: 9 mg/dL (ref 8.4–10.5)
Chloride: 108 mEq/L (ref 96–112)
Creatinine, Ser: 1 mg/dL (ref 0.4–1.5)
GFR: 85.19 mL/min (ref >60.00)
Glucose, Bld: 74 mg/dL (ref 70–99)
Potassium: 4 mEq/L (ref 3.5–5.1)
Sodium: 139 mEq/L (ref 135–145)

## 2013-11-26 LAB — HEPATIC FUNCTION PANEL
ALT: 20 U/L (ref 0–53)
AST: 24 U/L (ref 0–37)
Albumin: 4 g/dL (ref 3.5–5.2)
Alkaline Phosphatase: 45 U/L (ref 39–117)
Bilirubin, Direct: 0 mg/dL (ref 0.0–0.3)
Total Bilirubin: 0.8 mg/dL (ref 0.2–1.2)
Total Protein: 7.1 g/dL (ref 6.0–8.3)

## 2013-11-26 LAB — TSH: TSH: 1.13 u[IU]/mL (ref 0.35–4.50)

## 2013-11-26 LAB — LIPID PANEL
Cholesterol: 189 mg/dL (ref 0–200)
HDL: 71.3 mg/dL (ref >39.00)
LDL Cholesterol: 107 mg/dL — ABNORMAL HIGH (ref 0–99)
NonHDL: 117.7
Total CHOL/HDL Ratio: 3
Triglycerides: 54 mg/dL (ref 0.0–149.0)
VLDL: 10.8 mg/dL (ref 0.0–40.0)

## 2013-11-26 MED ORDER — CYCLOBENZAPRINE HCL 10 MG PO TABS: 10.0000 mg | ORAL_TABLET | Freq: Three times a day (TID) | ORAL | Status: DC | PRN

## 2013-11-26 NOTE — Progress Notes (Signed)
Pre visit review using our clinic review tool, if applicable. No additional management support is needed unless otherwise documented below in the visit note. 

## 2013-11-26 NOTE — Assessment & Plan Note (Signed)
Pt's PE WNL.  UTD on health maintenance.  Check labs.  Anticipatory guidance provided.  

## 2013-11-26 NOTE — Patient Instructions (Signed)
Follow up in 6 months to recheck cholesterol We'll notify you of your lab results and make any changes if needed Keep up the good work!  You look great! Call with any questions or concerns Enjoy the rest of your summer!

## 2013-11-26 NOTE — Progress Notes (Signed)
   Subjective:    Patient ID: MOUA RASMUSSON, male    DOB: 1955/07/31, 58 y.o.   MRN: [id number]  HPI CPE- UTD on colonoscopy.  No concerns   Review of Systems Patient reports no vision/hearing changes, anorexia, fever ,adenopathy, persistant/recurrent hoarseness, swallowing issues, chest pain, palpitations, edema, persistant/recurrent cough, hemoptysis, dyspnea (rest,exertional, paroxysmal nocturnal), gastrointestinal  bleeding (melena, rectal bleeding), abdominal pain, excessive heart burn, GU symptoms (dysuria, hematuria, voiding/incontinence issues) syncope, focal weakness, memory loss, numbness & tingling, skin/hair/nail changes, depression, anxiety, abnormal bruising/bleeding, musculoskeletal symptoms/signs.     Objective:   Physical Exam BP 140/80  Pulse 69  Temp(Src) 98 F (36.7 C) (Oral)  Resp 16  Ht 5\' 10"  (1.778 m)  Wt 188 lb 4 oz (85.39 kg)  BMI 27.01 kg/m2  SpO2 96%  General Appearance:    Alert, cooperative, no distress, appears stated age  Head:    Normocephalic, without obvious abnormality, atraumatic  Eyes:    PERRL, conjunctiva/corneas clear, EOM's intact, fundi    benign, both eyes       Ears:    Normal TM's and external ear canals, both ears  Nose:   Nares normal, septum midline, mucosa normal, no drainage   or sinus tenderness  Throat:   Lips, mucosa, and tongue normal; teeth and gums normal  Neck:   Supple, symmetrical, trachea midline, no adenopathy;       thyroid:  No enlargement/tenderness/nodules  Back:     Symmetric, no curvature, ROM normal, no CVA tenderness  Lungs:     Clear to auscultation bilaterally, respirations unlabored  Chest wall:    No tenderness or deformity  Heart:    Regular rate and rhythm, S1 and S2 normal, no murmur, rub   or gallop  Abdomen:     Soft, non-tender, bowel sounds active all four quadrants,    no masses, no organomegaly  Genitalia:    Normal male without lesion, discharge or tenderness  Rectal:    Normal tone, normal  prostate, no masses or tenderness  Extremities:   Extremities normal, atraumatic, no cyanosis or edema  Pulses:   2+ and symmetric all extremities  Skin:   Skin color, texture, turgor normal, no rashes or lesions  Lymph nodes:   Cervical, supraclavicular, and axillary nodes normal  Neurologic:   CNII-XII intact. Normal strength, sensation and reflexes      throughout          Assessment & Plan:

## 2013-11-30 ENCOUNTER — Other Ambulatory Visit: Payer: Self-pay | Admitting: Cardiovascular Disease

## 2014-01-07 ENCOUNTER — Telehealth: Payer: Self-pay

## 2014-01-07 MED ORDER — CYCLOBENZAPRINE HCL 10 MG PO TABS: 10.0000 mg | ORAL_TABLET | Freq: Three times a day (TID) | ORAL | Status: DC | PRN

## 2014-01-07 NOTE — Telephone Encounter (Signed)
Ok for #30 

## 2014-01-07 NOTE — Telephone Encounter (Signed)
Pt was given #30 flexeril at appt on 8.12.2015. Ok to fill?

## 2014-01-07 NOTE — Telephone Encounter (Signed)
Martin Malone [phone] [facility] called to see if he could get at least one month of muscle relaxer, it has really helped him, but he feels like he needs one more week.

## 2014-01-07 NOTE — Telephone Encounter (Signed)
Med filled.  

## 2014-01-26 ENCOUNTER — Telehealth: Payer: Self-pay

## 2014-01-26 NOTE — Telephone Encounter (Signed)
Please advise pt last seen 11/2013 for CPE.

## 2014-01-26 NOTE — Telephone Encounter (Signed)
Ok to take OTC Aleve twice daily for pain/inflammation.  ICE the elbow as needed

## 2014-01-26 NOTE — Telephone Encounter (Signed)
Martin Malone [phone] (M) [id number] [place] [place] [address]   [name] called and made an appointment for 02/06/14, but he is having R Elbow pain x 5-6 wks, it is not all the time, is there something he could take between now and his appointment that may help. He is leaving Thursday for the mountains and will be back on the 23rd.

## 2014-01-26 NOTE — Telephone Encounter (Signed)
Pt.notified

## 2014-02-06 ENCOUNTER — Ambulatory Visit (INDEPENDENT_AMBULATORY_CARE_PROVIDER_SITE_OTHER): Payer: BC Managed Care – PPO | Admitting: Family Medicine

## 2014-02-06 ENCOUNTER — Encounter: Payer: Self-pay | Admitting: Family Medicine

## 2014-02-06 VITALS — BP 130/80 | HR 77 | Temp 98.2°F | Resp 16 | Wt 196.1 lb

## 2014-02-06 DIAGNOSIS — M7711 Lateral epicondylitis, right elbow: Secondary | ICD-10-CM | POA: Insufficient documentation

## 2014-02-06 MED ORDER — MELOXICAM 15 MG PO TABS: 15.0000 mg | ORAL_TABLET | Freq: Every day | ORAL | Status: DC

## 2014-02-06 NOTE — Progress Notes (Signed)
   Subjective:    Patient ID: Martin Malone, male    DOB: 22-Apr-1955, 58 y.o.   MRN: [id number]  HPI Elbow pain- R elbow pain.  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  Has hx of similar which responded to magnetic brace in the past.  Bracing has not been effective this time.  No swelling.  Has iced w/o relief, ibuprofen w/o relief.  Some improvement w/ Aleve- but not resolution and pt will re-aggravate w/ any sort of repetitive motion.  No numbness or weakness.   Review of Systems For ROS see HPI     Objective:   Physical Exam  Vitals reviewed. Constitutional: He is oriented to person, place, and time. He appears well-developed and well-nourished. No distress.  Cardiovascular: Intact distal pulses.   Musculoskeletal: He exhibits tenderness (TTP over R lateral epicondyle). He exhibits no edema.  Neurological: He is alert and oriented to person, place, and time. He has normal reflexes. No cranial nerve deficit. Coordination normal.  Skin: Skin is warm and dry.          Assessment & Plan:

## 2014-02-06 NOTE — Assessment & Plan Note (Signed)
New.  Pt's sxs and PE consistent w/ R lateral epicondylitis.  Start scheduled NSAIDs.  Ice massage.  If no improvement, will refer to ortho.  Reviewed supportive care and red flags that should prompt return.  Pt expressed understanding and is in agreement w/ plan.

## 2014-02-06 NOTE — Progress Notes (Signed)
Pre visit review using our clinic review tool, if applicable. No additional management support is needed unless otherwise documented below in the visit note. 

## 2014-02-06 NOTE — Patient Instructions (Signed)
Follow up as needed This is lateral epicondylitis- start the Mobic daily.  Take w/ food Avoid additional ibuprofen, aleve, excedrin, etc- you can add tylenol as needed Use the ice cups to massage the elbow If no improvement in 7-10 days, please call so we can have you see ortho Call with any questions or concerns Hang in there!

## 2014-03-05 ENCOUNTER — Telehealth: Payer: Self-pay | Admitting: Family Medicine

## 2014-03-05 MED ORDER — MELOXICAM 15 MG PO TABS: 15.0000 mg | ORAL_TABLET | Freq: Every day | ORAL | Status: DC

## 2014-03-05 NOTE — Telephone Encounter (Signed)
Med filled. Pt notified and stated he will call back at the end of the month if pain is still severe for the ortho referral.

## 2014-03-05 NOTE — Telephone Encounter (Signed)
Caller name: Martin Malone Relation to pt: self Call back number: [phone] [facility] [address]  Reason for call:   Patient states that his elbow is still tender. He did say that the meloxicam has helped a lot but wants to know if he should continue taking meloxicam? Patient states that he is about out of meloxicam

## 2014-03-05 NOTE — Telephone Encounter (Signed)
Pt can continue Mobic for another month (ok to send refill) but if still having pain, would recommend ortho evaluation

## 2014-04-01 ENCOUNTER — Other Ambulatory Visit: Payer: Self-pay | Admitting: Family Medicine

## 2014-04-29 ENCOUNTER — Ambulatory Visit: Payer: Self-pay | Admitting: Family Medicine

## 2014-05-01 ENCOUNTER — Encounter: Payer: Self-pay | Admitting: Family Medicine

## 2014-05-01 ENCOUNTER — Ambulatory Visit (INDEPENDENT_AMBULATORY_CARE_PROVIDER_SITE_OTHER): Payer: BLUE CROSS/BLUE SHIELD | Admitting: Family Medicine

## 2014-05-01 VITALS — BP 120/82 | HR 56 | Temp 97.9°F | Resp 16 | Wt 189.1 lb

## 2014-05-01 DIAGNOSIS — M7711 Lateral epicondylitis, right elbow: Secondary | ICD-10-CM

## 2014-05-01 DIAGNOSIS — S76311D Strain of muscle, fascia and tendon of the posterior muscle group at thigh level, right thigh, subsequent encounter: Secondary | ICD-10-CM

## 2014-05-01 MED ORDER — PREDNISONE 10 MG PO TABS: ORAL_TABLET | ORAL | Status: DC

## 2014-05-01 NOTE — Assessment & Plan Note (Signed)
Pt reports pain is 90% better.  No pain on PE today.  Pt reports pain is now intermittent.  Not interested in ortho- requesting pred taper and will proceed w/ ortho if no improvement.  Pt expressed understanding and is in agreement w/ plan.

## 2014-05-01 NOTE — Assessment & Plan Note (Signed)
Chronic problem.  Pain is mostly improved but not resolved.  Discussed ortho but pt prefers to start Pred taper for relief.  If no resolution w/ prednisone, will need ortho.  Pt expressed understanding and is in agreement w/ plan.

## 2014-05-01 NOTE — Patient Instructions (Signed)
Follow up as needed Start the Prednisone as directed- take w/ food If no improvement in 2 weeks, please let me know so we can send you to ortho Continue to ice elbow, stretch/heat the hamstring Call with any questions or concerns Happy New Year!

## 2014-05-01 NOTE — Progress Notes (Signed)
   Subjective:    Patient ID: LARNIE HEART, male    DOB: July 28, 1955, 59 y.o.   MRN: [id number]  HPI Lateral epicondylitis R elbow- pt reports pain is 90-95% improved after taking Meloxicam.  But pt reports 'it just won't go away'. Pt is now having pain both medially and laterally.    Hamstring- pt reports still having pain at insertion on butt bone.  Also 'mostly better'.  Ran on Monday/Tuesday and pain started migrating down R leg.  Pt stopped running, stretched w/ some improvement.   Review of Systems For ROS see HPI     Objective:   Physical Exam  Constitutional: He is oriented to person, place, and time. He appears well-developed and well-nourished. No distress.  Cardiovascular: Intact distal pulses.   Musculoskeletal: He exhibits no tenderness (no TTP over lateral or medial epicondyle of R elbow, no TTP over R hamstring).  Neurological: He is alert and oriented to person, place, and time. He has normal reflexes. Coordination normal.  Skin: Skin is warm and dry.  Psychiatric: He has a normal mood and affect. His behavior is normal. Thought content normal.  Vitals reviewed.         Assessment & Plan:

## 2014-05-01 NOTE — Progress Notes (Signed)
Pre visit review using our clinic review tool, if applicable. No additional management support is needed unless otherwise documented below in the visit note. 

## 2014-05-29 ENCOUNTER — Ambulatory Visit (INDEPENDENT_AMBULATORY_CARE_PROVIDER_SITE_OTHER): Payer: BLUE CROSS/BLUE SHIELD | Admitting: Family Medicine

## 2014-05-29 ENCOUNTER — Encounter: Payer: Self-pay | Admitting: Family Medicine

## 2014-05-29 VITALS — BP 130/78 | HR 60 | Temp 98.2°F | Resp 16 | Wt 189.0 lb

## 2014-05-29 DIAGNOSIS — S76311D Strain of muscle, fascia and tendon of the posterior muscle group at thigh level, right thigh, subsequent encounter: Secondary | ICD-10-CM

## 2014-05-29 DIAGNOSIS — M7711 Lateral epicondylitis, right elbow: Secondary | ICD-10-CM

## 2014-05-29 DIAGNOSIS — E785 Hyperlipidemia, unspecified: Secondary | ICD-10-CM

## 2014-05-29 LAB — LIPID PANEL
Cholesterol: 187 mg/dL (ref 0–200)
HDL: 74.9 mg/dL (ref >39.00)
LDL Cholesterol: 102 mg/dL — ABNORMAL HIGH (ref 0–99)
NonHDL: 112.1
Total CHOL/HDL Ratio: 2
Triglycerides: 53 mg/dL (ref 0.0–149.0)
VLDL: 10.6 mg/dL (ref 0.0–40.0)

## 2014-05-29 LAB — BASIC METABOLIC PANEL
BUN: 25 mg/dL — ABNORMAL HIGH (ref 6–23)
CO2: 28 mEq/L (ref 19–32)
Calcium: 8.9 mg/dL (ref 8.4–10.5)
Chloride: 108 mEq/L (ref 96–112)
Creatinine, Ser: 1.02 mg/dL (ref 0.40–1.50)
GFR: 79.3 mL/min (ref >60.00)
Glucose, Bld: 91 mg/dL (ref 70–99)
Potassium: 5.4 mEq/L — ABNORMAL HIGH (ref 3.5–5.1)
Sodium: 140 mEq/L (ref 135–145)

## 2014-05-29 LAB — HEPATIC FUNCTION PANEL
ALT: 16 U/L (ref 0–53)
AST: 21 U/L (ref 0–37)
Albumin: 4.3 g/dL (ref 3.5–5.2)
Alkaline Phosphatase: 46 U/L (ref 39–117)
Bilirubin, Direct: 0.1 mg/dL (ref 0.0–0.3)
Total Bilirubin: 0.3 mg/dL (ref 0.2–1.2)
Total Protein: 7.2 g/dL (ref 6.0–8.3)

## 2014-05-29 MED ORDER — PREDNISONE 10 MG PO TABS
ORAL_TABLET | ORAL | Status: DC
Start: 2014-05-29 — End: 2014-12-01

## 2014-05-29 NOTE — Assessment & Plan Note (Signed)
Recurring problem.  Discussed that this would likely be an ongoing issue for him and he should treat symptomatically.  He wants to repeat pred taper to 'totally knock it out'.  Told him I would do this x1 but would not do it again unless major flare due to side effects of medication.  Pt expressed understanding and is in agreement w/ plan.

## 2014-05-29 NOTE — Assessment & Plan Note (Signed)
Chronic problem for pt.  He reports that pred taper nearly resolved his pain and he only has intermittent 'twinges' currently.  Wants to repeat pred taper to 'knock it out completely'.  Told him I would repeat x1 but would not do it again after that unless serious flare due to side effects of medication.  Pt expressed understanding and is in agreement w/ plan.

## 2014-05-29 NOTE — Progress Notes (Signed)
   Subjective:    Patient ID: Martin Malone, male    DOB: May 30, 1955, 59 y.o.   MRN: [id number]  HPI Hyperlipidemia- chronic problem, on Lipitor.  Denies abd pain, N/V, CP, SOB.  R lateral epicondylitis- recurring problem for pt.  'all but gone' after using the prednisone taper.  Today is asymptomatic but yesterday 'tingled' for 1/2 the day.  R hamstring strain- pt reports pain doesn't interfere w/ activities, more painful w/ sitting.  Pain dramatically improved s/p prednisone taper.     Review of Systems For ROS see HPI     Objective:   Physical Exam  Constitutional: He is oriented to person, place, and time. He appears well-developed and well-nourished. No distress.  HENT:  Head: Normocephalic and atraumatic.  Eyes: Conjunctivae and EOM are normal. Pupils are equal, round, and reactive to light.  Neck: Normal range of motion. Neck supple. No thyromegaly present.  Cardiovascular: Normal rate, regular rhythm, normal heart sounds and intact distal pulses.   No murmur heard. Pulmonary/Chest: Effort normal and breath sounds normal. No respiratory distress.  Abdominal: Soft. Bowel sounds are normal. He exhibits no distension.  Musculoskeletal: He exhibits no edema or tenderness (no TTP over R lateral or medial epicondyle.  minimal TTP over origin of R hamstring).  Lymphadenopathy:    He has no cervical adenopathy.  Neurological: He is alert and oriented to person, place, and time. No cranial nerve deficit.  Skin: Skin is warm and dry.  Psychiatric: He has a normal mood and affect. His behavior is normal.  Vitals reviewed.         Assessment & Plan:

## 2014-05-29 NOTE — Progress Notes (Signed)
Pre visit review using our clinic review tool, if applicable. No additional management support is needed unless otherwise documented below in the visit note. 

## 2014-05-29 NOTE — Assessment & Plan Note (Signed)
Chronic problem.  Tolerating statin w/o difficulty.  Check labs.  Adjust meds prn  

## 2014-05-29 NOTE — Patient Instructions (Signed)
Schedule your complete physical in 6 months We'll notify you of your lab results and make any changes if needed Take the prednisone as directed Continue to ice and stretch for pain relief Call with any questions or concerns Have a great trip!!!

## 2014-12-01 ENCOUNTER — Telehealth: Payer: Self-pay | Admitting: *Deleted

## 2014-12-01 ENCOUNTER — Encounter: Payer: Self-pay | Admitting: *Deleted

## 2014-12-01 NOTE — Telephone Encounter (Signed)
Pre-Visit Call completed with patient and chart updated.   Pre-Visit Info documented in Specialty Comments under [employer].    

## 2014-12-02 ENCOUNTER — Encounter: Payer: Self-pay | Admitting: Family Medicine

## 2014-12-02 ENCOUNTER — Ambulatory Visit (INDEPENDENT_AMBULATORY_CARE_PROVIDER_SITE_OTHER): Payer: BLUE CROSS/BLUE SHIELD | Admitting: Family Medicine

## 2014-12-02 VITALS — BP 124/76 | HR 64 | Temp 98.0°F | Resp 16 | Ht 70.0 in | Wt 187.6 lb

## 2014-12-02 DIAGNOSIS — S76311S Strain of muscle, fascia and tendon of the posterior muscle group at thigh level, right thigh, sequela: Secondary | ICD-10-CM

## 2014-12-02 DIAGNOSIS — Z0000 Encounter for general adult medical examination without abnormal findings: Secondary | ICD-10-CM | POA: Diagnosis not present

## 2014-12-02 LAB — HEPATIC FUNCTION PANEL
ALT: 20 U/L (ref 0–53)
AST: 23 U/L (ref 0–37)
Albumin: 4.6 g/dL (ref 3.5–5.2)
Alkaline Phosphatase: 38 U/L — ABNORMAL LOW (ref 39–117)
Bilirubin, Direct: 0.2 mg/dL (ref 0.0–0.3)
Total Bilirubin: 0.7 mg/dL (ref 0.2–1.2)
Total Protein: 7.2 g/dL (ref 6.0–8.3)

## 2014-12-02 LAB — LIPID PANEL
Cholesterol: 211 mg/dL — ABNORMAL HIGH (ref 0–200)
HDL: 80.9 mg/dL (ref >39.00)
LDL Cholesterol: 117 mg/dL — ABNORMAL HIGH (ref 0–99)
NonHDL: 130.53
Total CHOL/HDL Ratio: 3
Triglycerides: 66 mg/dL (ref 0.0–149.0)
VLDL: 13.2 mg/dL (ref 0.0–40.0)

## 2014-12-02 LAB — CBC WITH DIFFERENTIAL/PLATELET
Basophils Absolute: 0 10*3/uL (ref 0.0–0.1)
Basophils Relative: 0.5 % (ref 0.0–3.0)
Eosinophils Absolute: 0.1 10*3/uL (ref 0.0–0.7)
Eosinophils Relative: 1.7 % (ref 0.0–5.0)
HCT: 42 % (ref 39.0–52.0)
Hemoglobin: 14 g/dL (ref 13.0–17.0)
Lymphocytes Relative: 27.5 % (ref 12.0–46.0)
Lymphs Abs: 2 10*3/uL (ref 0.7–4.0)
MCHC: 33.5 g/dL (ref 30.0–36.0)
MCV: 97.5 fl (ref 78.0–100.0)
Monocytes Absolute: 0.6 10*3/uL (ref 0.1–1.0)
Monocytes Relative: 7.7 % (ref 3.0–12.0)
Neutro Abs: 4.6 10*3/uL (ref 1.4–7.7)
Neutrophils Relative %: 62.6 % (ref 43.0–77.0)
Platelets: 267 10*3/uL (ref 150.0–400.0)
RBC: 4.3 Mil/uL (ref 4.22–5.81)
RDW: 15 % (ref 11.5–15.5)
WBC: 7.4 10*3/uL (ref 4.0–10.5)

## 2014-12-02 LAB — BASIC METABOLIC PANEL
BUN: 23 mg/dL (ref 6–23)
CO2: 26 mEq/L (ref 19–32)
Calcium: 9.3 mg/dL (ref 8.4–10.5)
Chloride: 101 mEq/L (ref 96–112)
Creatinine, Ser: 1.1 mg/dL (ref 0.40–1.50)
GFR: 72.55 mL/min (ref >60.00)
Glucose, Bld: 100 mg/dL — ABNORMAL HIGH (ref 70–99)
Potassium: 4 mEq/L (ref 3.5–5.1)
Sodium: 136 mEq/L (ref 135–145)

## 2014-12-02 LAB — PSA: PSA: 1.23 ng/mL (ref 0.10–4.00)

## 2014-12-02 LAB — TSH: TSH: 2.69 u[IU]/mL (ref 0.35–4.50)

## 2014-12-02 MED ORDER — MELOXICAM 15 MG PO TABS: 15.0000 mg | ORAL_TABLET | Freq: Every day | ORAL | Status: DC

## 2014-12-02 NOTE — Assessment & Plan Note (Signed)
Pt's PE WNL.  UTD on colonoscopy.  Check labs.  Anticipatory guidance provided.

## 2014-12-02 NOTE — Assessment & Plan Note (Signed)
Ongoing issue for pt.  He is walking 55 miles/week and suspect this is an overuse injury.  Offered ortho evaluation.  Pt declined.  Prefers to restart Mobic for pain and inflammation.  Refills provided.  If no improvement, pt to see ortho.  Pt expressed understanding and is in agreement w/ plan.

## 2014-12-02 NOTE — Patient Instructions (Signed)
Follow up in 6 months to recheck cholesterol We'll notify you of your lab results and make any changes if needed Keep up the good work!  You look great! Start the Mobic once daily- w/ food- for the inflammation.  If no improvement in the hamstring pain, please let me know so we can refer for additional work up Start Melatonin prior to bed to improve sleep Call with any questions or concerns Enjoy the rest of your summer!!!

## 2014-12-02 NOTE — Progress Notes (Signed)
   Subjective:    Patient ID: Martin Malone, male    DOB: Sep 03, 1955, 59 y.o.   MRN: [id number]  HPI CPE- UTD on colonoscopy ([facility], 2008).  Pt is walking 55 miles/week   Review of Systems Patient reports no vision/hearing changes, anorexia, fever ,adenopathy, persistant/recurrent hoarseness, swallowing issues, chest pain, palpitations, edema, persistant/recurrent cough, hemoptysis, dyspnea (rest,exertional, paroxysmal nocturnal), gastrointestinal  bleeding (melena, rectal bleeding), abdominal pain, excessive heart burn, GU symptoms (dysuria, hematuria, voiding/incontinence issues) syncope, focal weakness, memory loss, numbness & tingling, skin/hair/nail changes, depression, anxiety, abnormal bruising/bleeding.  Continues R hamstring discomfort ranging from 1.5-3/10.  Previously did well on Mobic.  Pt walking 55 miles/week.  Insomnia- pt is able to fall asleep w/o difficulty but will wake around 1:30 and then watch TV or do other chores until he falls back asleep for a few hours.  Pt denies feeling tired during the day.  sxs started 1-2 weeks ago.      Objective:   Physical Exam BP 124/76 mmHg  Pulse 64  Temp(Src) 98 F (36.7 C) (Oral)  Resp 16  Ht 5\' 10"  (1.778 m)  Wt 187 lb 9.6 oz (85.095 kg)  BMI 26.92 kg/m2  SpO2 98%  General Appearance:    Alert, cooperative, no distress, appears stated age  Head:    Normocephalic, without obvious abnormality, atraumatic  Eyes:    PERRL, conjunctiva/corneas clear, EOM's intact, fundi    benign, both eyes       Ears:    Normal TM's and external ear canals, both ears  Nose:   Nares normal, septum midline, mucosa normal, no drainage   or sinus tenderness  Throat:   Lips, mucosa, and tongue normal; teeth and gums normal  Neck:   Supple, symmetrical, trachea midline, no adenopathy;       thyroid:  No enlargement/tenderness/nodules  Back:     Symmetric, no curvature, ROM normal, no CVA tenderness  Lungs:     Clear to auscultation bilaterally,  respirations unlabored  Chest wall:    No tenderness or deformity  Heart:    Regular rate and rhythm, S1 and S2 normal, no murmur, rub   or gallop  Abdomen:     Soft, non-tender, bowel sounds active all four quadrants,    no masses, no organomegaly  Genitalia:    Normal male without lesion, discharge or tenderness  Rectal:    Normal tone, normal prostate, no masses or tenderness  Extremities:   Extremities normal, atraumatic, no cyanosis or edema  Pulses:   2+ and symmetric all extremities  Skin:   Skin color, texture, turgor normal, no rashes or lesions  Lymph nodes:   Cervical, supraclavicular, and axillary nodes normal  Neurologic:   CNII-XII intact. Normal strength, sensation and reflexes      throughout          Assessment & Plan:

## 2015-01-15 ENCOUNTER — Ambulatory Visit (INDEPENDENT_AMBULATORY_CARE_PROVIDER_SITE_OTHER): Payer: BLUE CROSS/BLUE SHIELD | Admitting: Family

## 2015-01-15 ENCOUNTER — Encounter: Payer: Self-pay | Admitting: Family

## 2015-01-15 VITALS — BP 139/80 | HR 54 | Temp 98.1°F | Resp 16 | Ht 70.0 in | Wt 189.9 lb

## 2015-01-15 DIAGNOSIS — M542 Cervicalgia: Secondary | ICD-10-CM

## 2015-01-15 MED ORDER — METHYLPREDNISOLONE 4 MG PO TBPK: ORAL_TABLET | ORAL | Status: DC

## 2015-01-15 MED ORDER — CYCLOBENZAPRINE HCL 5 MG PO TABS: 5.0000 mg | ORAL_TABLET | Freq: Every evening | ORAL | Status: DC | PRN

## 2015-01-15 NOTE — Patient Instructions (Signed)
Start medrol dose pak (steroid medicine) and flexeril (muscle relaxer) as needed. Call if symptoms worsen, if you develop weakness or if symptoms are not improved in 1 week.

## 2015-01-15 NOTE — Progress Notes (Signed)
Subjective:    Patient ID: Martin Malone, male    DOB: [birth year], 59 y.o.   MRN: [id number]  HPI  Martin Malone is a 59 yr old male who presents today with chief complaint of right sided neck and shoulder pain.  Pain has been present x 2-3 weeks.  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  He reports that the pain has been constant until a few days ago.  Massage can help the pain.  Overall the pain has improved. He denies numbness or weakness in his arm.  Reports that he used an inversion table yesterday which seemed to help. Heat also helps.   Review of Systems See HPI  Past Medical History  Diagnosis Date  . GERD (gastroesophageal reflux disease)   . Rib fracture   . Hyperlipidemia     Social History   Social History  . Marital Status: Married    Spouse Name: N/A  . Number of Children: 1  . Years of Education: N/A   Occupational History  . facility manager    Social History Main Topics  . Smoking status: Former Smoker    Quit date: 04/17/1977  . Smokeless tobacco: Not on file  . Alcohol Use: 1.2 oz/week    2 Glasses of wine per week  . Drug Use: Not on file  . Sexual Activity: Not on file   Other Topics Concern  . Not on file   Social History Narrative    Past Surgical History  Procedure Laterality Date  . Total knee arthroplasty  2006    Family History  Problem Relation Age of Onset  . Dementia Mother   . Pancreatitis Father   . Coronary artery disease Brother     No Known Allergies  Current Outpatient Prescriptions on File Prior to Visit  Medication Sig Dispense Refill  . aspirin 81 MG tablet Take 81 mg by mouth daily.    Marland Kitchen atorvastatin (LIPITOR) 10 MG tablet TAKE 1 TABLET BY MOUTH EVERY DAY AT BEDTIME 90 tablet 6  . Cholecalciferol 1000 UNITS capsule Take 2,000 Units by  mouth daily.    Marland Kitchen guaiFENesin (MUCINEX) 600 MG 12 hr tablet Take 600 mg by mouth 2 (two) times daily as needed.    Marland Kitchen ibuprofen (ADVIL,MOTRIN) 200 MG tablet Take 600 mg by mouth daily as needed.     Marland Kitchen KRILL OIL PO Take 2 tablets by mouth daily.    . Multiple Vitamin (MULTIVITAMIN) tablet Take 1 tablet by mouth daily.    . Turmeric 500 MG CAPS Take 2 tablets by mouth daily.     No current facility-administered medications on file prior to visit.    BP 139/80 mmHg  Pulse 54  Temp(Src) 98.1 F (36.7 C) (Oral)  Resp 16  Ht 5\' 10"  (1.778 m)  Wt 189 lb 14.4 oz (86.138 kg)  BMI 27.25 kg/m2  SpO2 99%       Objective:   Physical Exam  Constitutional: He is oriented to person, place, and time. He appears well-developed and well-nourished.  Neck: Neck supple.  Musculoskeletal:       Cervical back: He exhibits no tenderness.  Neurological: He is alert and oriented to person, place, and time.  Reflex Scores:      Bicep reflexes are 1+ on the right side  and 2+ on the left side.      Brachioradialis reflexes are 2+ on the left side. Bilateral UE strength and LE strength is 5/5  Skin: Skin is warm and dry.  Psychiatric: He has a normal mood and affect. His behavior is normal. Thought content normal.          Assessment & Plan:  Cervical Neck pain with radiculopathy. Remote hx of same. Will rx with medrol dose pak, prn flexeril.  If symptoms worsen or do not improve, will need MRI given previous MRI findings- pt is aware and verbalizes understanding.

## 2015-02-10 ENCOUNTER — Other Ambulatory Visit: Payer: Self-pay | Admitting: Cardiovascular Disease

## 2015-02-11 ENCOUNTER — Telehealth: Payer: Self-pay | Admitting: Family Medicine

## 2015-02-11 MED ORDER — ATORVASTATIN CALCIUM 10 MG PO TABS: 10.0000 mg | ORAL_TABLET | Freq: Every day | ORAL | Status: DC

## 2015-02-11 NOTE — Telephone Encounter (Signed)
Caller name: Keimari   Relationship to patient: Self   Can be reached: [phone]  Pharmacy: [id number] [place], [place]  Reason for call: Pt says that he normally get a 90 day supply of his LIPITOR. Pt states that he only got a 30 day supply this refill. Pt would like to have his normal 90 day supply sent to pharmacy. Pt says that he is going to cancel the 30 days supply that's currently at pharmacy and wait until he receive his new Rx.

## 2015-02-11 NOTE — Telephone Encounter (Signed)
Dr. Burt Knack only gave pt #30 and said that he needed an office visit. Pt just seen you for CPE in August. Lipids were 211. Please advise if ok to fill?

## 2015-02-11 NOTE — Telephone Encounter (Signed)
Ok for #90, 1 refill 

## 2015-02-11 NOTE — Telephone Encounter (Signed)
Medication filled to pharmacy as requested.   

## 2015-02-18 ENCOUNTER — Telehealth: Payer: Self-pay

## 2015-02-18 DIAGNOSIS — M5090 Cervical disc disorder, unspecified, unspecified cervical region: Secondary | ICD-10-CM

## 2015-02-18 NOTE — Telephone Encounter (Signed)
Patient still having neck and shoulder pain medication complete  patient said feels better with massage but pain returns later please advise

## 2015-02-19 NOTE — Telephone Encounter (Signed)
I will place order for MRI for further evaluation.

## 2015-02-19 NOTE — Telephone Encounter (Signed)
Notified pt. He states he wants to hold off on MRI. Has been taking 2 ibuprofen and 1 tylenol in the mornings and does ok unless he is working in the yard or moves in certain positions. Pt was wondering if a muscle relaxer would help since pain will seem to get better after he massages his shoulder area where pain is occuring. Please advise?

## 2015-02-19 NOTE — Telephone Encounter (Signed)
Patient checking on the status of message below best # [phone]

## 2015-02-21 MED ORDER — CYCLOBENZAPRINE HCL 5 MG PO TABS: 5.0000 mg | ORAL_TABLET | Freq: Every evening | ORAL | Status: DC | PRN

## 2015-02-22 NOTE — Telephone Encounter (Signed)
Mychart message has not been read yet. Attempted to reach pt and left message on home # to check mychart message and let us know if he has any questions.

## 2015-03-12 ENCOUNTER — Other Ambulatory Visit: Payer: Self-pay | Admitting: Family

## 2015-06-04 ENCOUNTER — Ambulatory Visit: Payer: BLUE CROSS/BLUE SHIELD | Admitting: Family Medicine

## 2015-06-14 ENCOUNTER — Ambulatory Visit (INDEPENDENT_AMBULATORY_CARE_PROVIDER_SITE_OTHER): Payer: BLUE CROSS/BLUE SHIELD | Admitting: Family Medicine

## 2015-06-14 ENCOUNTER — Encounter: Payer: Self-pay | Admitting: Family Medicine

## 2015-06-14 VITALS — BP 126/80 | HR 72 | Temp 98.2°F | Resp 16 | Ht 70.0 in | Wt 199.2 lb

## 2015-06-14 DIAGNOSIS — E785 Hyperlipidemia, unspecified: Secondary | ICD-10-CM

## 2015-06-14 LAB — LIPID PANEL
Cholesterol: 206 mg/dL — ABNORMAL HIGH (ref 0–200)
HDL: 104.1 mg/dL (ref >39.00)
LDL Cholesterol: 92 mg/dL (ref 0–99)
NonHDL: 101.88
Total CHOL/HDL Ratio: 2
Triglycerides: 49 mg/dL (ref 0.0–149.0)
VLDL: 9.8 mg/dL (ref 0.0–40.0)

## 2015-06-14 LAB — BASIC METABOLIC PANEL
BUN: 15 mg/dL (ref 6–23)
CO2: 27 mEq/L (ref 19–32)
Calcium: 8.7 mg/dL (ref 8.4–10.5)
Chloride: 101 mEq/L (ref 96–112)
Creatinine, Ser: 0.95 mg/dL (ref 0.40–1.50)
GFR: 85.78 mL/min (ref >60.00)
Glucose, Bld: 85 mg/dL (ref 70–99)
Potassium: 4.3 mEq/L (ref 3.5–5.1)
Sodium: 136 mEq/L (ref 135–145)

## 2015-06-14 LAB — HEPATIC FUNCTION PANEL
ALT: 29 U/L (ref 0–53)
AST: 35 U/L (ref 0–37)
Albumin: 4.3 g/dL (ref 3.5–5.2)
Alkaline Phosphatase: 45 U/L (ref 39–117)
Bilirubin, Direct: 0.1 mg/dL (ref 0.0–0.3)
Total Bilirubin: 0.6 mg/dL (ref 0.2–1.2)
Total Protein: 7.2 g/dL (ref 6.0–8.3)

## 2015-06-14 NOTE — Progress Notes (Signed)
Pre visit review using our clinic review tool, if applicable. No additional management support is needed unless otherwise documented below in the visit note. 

## 2015-06-14 NOTE — Assessment & Plan Note (Signed)
Chronic problem.  Tolerating statin w/o difficulty.  Check labs.  Adjust meds prn  

## 2015-06-14 NOTE — Patient Instructions (Signed)
Schedule your complete physical in 6 months We'll notify you of your lab results and make any changes if needed Keep up the good work on healthy diet and regular exercise- you look great! Call with any questions or concerns If you want to join [place] at the new [place] office, any scheduled appointments will automatically transfer and we will see you at [address] [place] [address] [place], [place] [address] ([place]) Have a great spring!!!

## 2015-06-14 NOTE — Progress Notes (Signed)
   Subjective:    Patient ID: Martin Malone, male    DOB: 1955/08/22, 60 y.o.   MRN: [id number]  HPI Hyperlipidemia- chronic problem, on Lipitor.  Exercising regularly- walking and doing aerobics twice weekly.  Denies CP, SOB, HAs, visual changes, edema, abd pain, N/V.   Review of Systems For ROS see HPI     Objective:   Physical Exam  Constitutional: He is oriented to person, place, and time. He appears well-developed and well-nourished. No distress.  HENT:  Head: Normocephalic and atraumatic.  Eyes: Conjunctivae and EOM are normal. Pupils are equal, round, and reactive to light.  Neck: Normal range of motion. Neck supple. No thyromegaly present.  Cardiovascular: Normal rate, regular rhythm, normal heart sounds and intact distal pulses.   No murmur heard. Pulmonary/Chest: Effort normal and breath sounds normal. No respiratory distress.  Abdominal: Soft. Bowel sounds are normal. He exhibits no distension.  Musculoskeletal: He exhibits no edema.  Lymphadenopathy:    He has no cervical adenopathy.  Neurological: He is alert and oriented to person, place, and time. No cranial nerve deficit.  Skin: Skin is warm and dry.  Psychiatric: He has a normal mood and affect. His behavior is normal.  Vitals reviewed.         Assessment & Plan:

## 2015-06-24 ENCOUNTER — Telehealth: Payer: Self-pay | Admitting: Family Medicine

## 2015-06-24 MED ORDER — PREDNISONE 10 MG PO TABS
ORAL_TABLET | ORAL | Status: AC
Start: 2015-06-24 — End: 2015-07-08

## 2015-06-24 NOTE — Telephone Encounter (Signed)
Can be reached: [phone] Pharmacy: [id number] [place], [place]  Reason for call: Pt still having pain and inflammation in ribs. He states Dr. Birdie Riddle told him if it was still bothering him in a week she would call in something for the inflammation.

## 2015-06-24 NOTE — Telephone Encounter (Signed)
Ok for Pred taper 10mg , 3 x3 days, 2 x3 days, 1 x3 days #18.

## 2015-06-24 NOTE — Telephone Encounter (Signed)
Medication filled to pharmacy as requested.  Pt informed.   

## 2015-07-02 ENCOUNTER — Ambulatory Visit: Payer: BLUE CROSS/BLUE SHIELD | Admitting: Family Medicine

## 2015-07-13 ENCOUNTER — Encounter: Payer: Self-pay | Admitting: Family Medicine

## 2015-07-13 ENCOUNTER — Ambulatory Visit (INDEPENDENT_AMBULATORY_CARE_PROVIDER_SITE_OTHER): Payer: BLUE CROSS/BLUE SHIELD | Admitting: Family Medicine

## 2015-07-13 VITALS — BP 140/88 | HR 90 | Temp 98.2°F | Resp 18 | Wt 192.1 lb

## 2015-07-13 DIAGNOSIS — J209 Acute bronchitis, unspecified: Secondary | ICD-10-CM

## 2015-07-13 MED ORDER — PROMETHAZINE-DM 6.25-15 MG/5ML PO SYRP: 5.0000 mL | ORAL_SOLUTION | Freq: Four times a day (QID) | ORAL | Status: DC | PRN

## 2015-07-13 MED ORDER — AZITHROMYCIN 250 MG PO TABS: ORAL_TABLET | ORAL | Status: DC

## 2015-07-13 MED ORDER — PREDNISONE 10 MG PO TABS: ORAL_TABLET | ORAL | Status: DC

## 2015-07-13 MED ORDER — ALBUTEROL SULFATE HFA 108 (90 BASE) MCG/ACT IN AERS: 2.0000 | INHALATION_SPRAY | Freq: Four times a day (QID) | RESPIRATORY_TRACT | Status: DC | PRN

## 2015-07-13 MED ORDER — ZOLPIDEM TARTRATE 10 MG PO TABS: 10.0000 mg | ORAL_TABLET | Freq: Every evening | ORAL | Status: DC | PRN

## 2015-07-13 NOTE — Progress Notes (Signed)
Pre visit review using our clinic review tool, if applicable. No additional management support is needed unless otherwise documented below in the visit note. 

## 2015-07-13 NOTE — Assessment & Plan Note (Signed)
New.  Pt's sxs are consistent w/ bronchitis w/ bronchospasm.  Start Zpack to cover any atypical infxns.  Prednisone to improve wheezing and airway inflammation.  Albuterol prn.  Cough meds prn.  Reviewed supportive care and red flags that should prompt return.  Pt expressed understanding and is in agreement w/ plan.

## 2015-07-13 NOTE — Patient Instructions (Signed)
Follow up as needed Start the Prednisone as directed- take w/ food Use the albuterol- 2 puffs every 4 hrs as needed for cough, wheezing, shortness of breath Take the Zpack as directed Drink plenty of fluids REST! Mucinex DM for daytime cough, prescription syrup for nights/weekends (will cause drowsiness) Call with any questions or concerns- particularly if not improving Hang in there!!!

## 2015-07-13 NOTE — Progress Notes (Signed)
   Subjective:    Patient ID: Martin Malone, male    DOB: 03-21-1956, 60 y.o.   MRN: [id number]  HPI URI- sxs started Thursday night w/ nasal congestion and drainage.  Friday had sinus pressure.  Saturday developed fever and body aches.  Head cleared yesterday but now having severe cough.  Cough is productive of clear sputum.  Unable to lie down or rest w/o coughing every 3 minutes.  OTC cough syrup provides ~45 minutes of relief.  Took decongestant w/ some relief of cough.  No longer having fevers or body aches.   Review of Systems For ROS see HPI     Objective:   Physical Exam  Constitutional: He appears well-developed and well-nourished. No distress.  HENT:  Head: Normocephalic and atraumatic.  Right Ear: Tympanic membrane normal.  Left Ear: Tympanic membrane normal.  Nose: No mucosal edema or rhinorrhea. Right sinus exhibits no maxillary sinus tenderness and no frontal sinus tenderness. Left sinus exhibits no maxillary sinus tenderness and no frontal sinus tenderness.  Mouth/Throat: Mucous membranes are normal. No oropharyngeal exudate, posterior oropharyngeal edema or posterior oropharyngeal erythema.  Eyes: Conjunctivae and EOM are normal. Pupils are equal, round, and reactive to light.  Neck: Normal range of motion. Neck supple.  Cardiovascular: Normal rate, regular rhythm and normal heart sounds.   Pulmonary/Chest: Effort normal. No respiratory distress. He has wheezes (scattered expiratory wheezes w/o crackles).  + hacking cough  Lymphadenopathy:    He has no cervical adenopathy.  Skin: Skin is warm and dry.  Vitals reviewed.         Assessment & Plan:

## 2015-08-04 ENCOUNTER — Other Ambulatory Visit: Payer: Self-pay | Admitting: General Practice

## 2015-08-04 MED ORDER — ATORVASTATIN CALCIUM 10 MG PO TABS: 10.0000 mg | ORAL_TABLET | Freq: Every day | ORAL | Status: DC

## 2015-12-15 ENCOUNTER — Encounter: Payer: BLUE CROSS/BLUE SHIELD | Admitting: Family Medicine

## 2015-12-22 ENCOUNTER — Encounter: Payer: Self-pay | Admitting: Family Medicine

## 2015-12-22 ENCOUNTER — Ambulatory Visit (INDEPENDENT_AMBULATORY_CARE_PROVIDER_SITE_OTHER): Payer: BLUE CROSS/BLUE SHIELD | Admitting: Family Medicine

## 2015-12-22 VITALS — BP 128/86 | HR 79 | Temp 98.3°F | Resp 17 | Ht 70.0 in | Wt 196.2 lb

## 2015-12-22 DIAGNOSIS — Z23 Encounter for immunization: Secondary | ICD-10-CM

## 2015-12-22 DIAGNOSIS — Z0000 Encounter for general adult medical examination without abnormal findings: Secondary | ICD-10-CM

## 2015-12-22 LAB — CBC WITH DIFFERENTIAL/PLATELET
Basophils Absolute: 0 K/uL (ref 0.0–0.1)
Basophils Relative: 0.5 % (ref 0.0–3.0)
Eosinophils Absolute: 0.1 K/uL (ref 0.0–0.7)
Eosinophils Relative: 1.7 % (ref 0.0–5.0)
HCT: 41 % (ref 39.0–52.0)
Hemoglobin: 13.8 g/dL (ref 13.0–17.0)
Lymphocytes Relative: 24.7 % (ref 12.0–46.0)
Lymphs Abs: 2 K/uL (ref 0.7–4.0)
MCHC: 33.7 g/dL (ref 30.0–36.0)
MCV: 96.4 fl (ref 78.0–100.0)
Monocytes Absolute: 0.6 K/uL (ref 0.1–1.0)
Monocytes Relative: 6.9 % (ref 3.0–12.0)
Neutro Abs: 5.4 K/uL (ref 1.4–7.7)
Neutrophils Relative %: 66.2 % (ref 43.0–77.0)
Platelets: 234 K/uL (ref 150.0–400.0)
RBC: 4.26 Mil/uL (ref 4.22–5.81)
RDW: 15.5 % (ref 11.5–15.5)
WBC: 8.2 K/uL (ref 4.0–10.5)

## 2015-12-22 LAB — LIPID PANEL
Cholesterol: 236 mg/dL — ABNORMAL HIGH (ref 0–200)
HDL: 105.2 mg/dL (ref >39.00)
LDL Cholesterol: 121 mg/dL — ABNORMAL HIGH (ref 0–99)
NonHDL: 130.97
Total CHOL/HDL Ratio: 2
Triglycerides: 48 mg/dL (ref 0.0–149.0)
VLDL: 9.6 mg/dL (ref 0.0–40.0)

## 2015-12-22 LAB — HEPATIC FUNCTION PANEL
ALT: 23 U/L (ref 0–53)
AST: 25 U/L (ref 0–37)
Albumin: 4.6 g/dL (ref 3.5–5.2)
Alkaline Phosphatase: 46 U/L (ref 39–117)
Bilirubin, Direct: 0.2 mg/dL (ref 0.0–0.3)
Total Bilirubin: 0.9 mg/dL (ref 0.2–1.2)
Total Protein: 7.4 g/dL (ref 6.0–8.3)

## 2015-12-22 LAB — BASIC METABOLIC PANEL
BUN: 21 mg/dL (ref 6–23)
CO2: 28 mEq/L (ref 19–32)
Calcium: 9.2 mg/dL (ref 8.4–10.5)
Chloride: 102 mEq/L (ref 96–112)
Creatinine, Ser: 1.04 mg/dL (ref 0.40–1.50)
GFR: 77.13 mL/min (ref >60.00)
Glucose, Bld: 89 mg/dL (ref 70–99)
Potassium: 5.3 mEq/L — ABNORMAL HIGH (ref 3.5–5.1)
Sodium: 137 mEq/L (ref 135–145)

## 2015-12-22 MED ORDER — ZOLPIDEM TARTRATE 10 MG PO TABS: 10.0000 mg | ORAL_TABLET | Freq: Every evening | ORAL | 1 refills | Status: DC | PRN

## 2015-12-22 NOTE — Progress Notes (Signed)
Pre visit review using our clinic review tool, if applicable. No additional management support is needed unless otherwise documented below in the visit note. 

## 2015-12-22 NOTE — Assessment & Plan Note (Signed)
Pt's PE WNL.  UTD on colonoscopy until next year.  Due for flu today.  Check labs.  Anticipatory guidance provided.

## 2015-12-22 NOTE — Progress Notes (Signed)
   Subjective:    Patient ID: ARMARI POINT, male    DOB: 18-Feb-1956, 60 y.o.   MRN: [id number]  HPI CPE- UTD on colonoscopy (due next year, w/ [facility]).  Pt to get flu shot today.  Walking daily, 6-7 miles/day.  No concerns today.   Review of Systems Patient reports no vision/hearing changes, anorexia, fever ,adenopathy, persistant/recurrent hoarseness, swallowing issues, chest pain, palpitations, edema, persistant/recurrent cough, hemoptysis, dyspnea (rest,exertional, paroxysmal nocturnal), gastrointestinal  bleeding (melena, rectal bleeding), abdominal pain, excessive heart burn, GU symptoms (dysuria, hematuria, voiding/incontinence issues) syncope, focal weakness, memory loss, numbness & tingling, skin/hair/nail changes, depression, anxiety, abnormal bruising/bleeding, musculoskeletal symptoms/signs.     Objective:   Physical Exam BP 128/86   Pulse 79   Temp 98.3 F (36.8 C) (Oral)   Resp 17   Ht 5\' 10"  (1.778 m)   Wt 196 lb 4 oz (89 kg)   SpO2 98%   BMI 28.16 kg/m   General Appearance:    Alert, cooperative, no distress, appears stated age  Head:    Normocephalic, without obvious abnormality, atraumatic  Eyes:    PERRL, conjunctiva/corneas clear, EOM's intact, fundi    benign, both eyes       Ears:    Normal TM's and external ear canals, both ears  Nose:   Nares normal, septum midline, mucosa normal, no drainage   or sinus tenderness  Throat:   Lips, mucosa, and tongue normal; teeth and gums normal  Neck:   Supple, symmetrical, trachea midline, no adenopathy;       thyroid:  No enlargement/tenderness/nodules  Back:     Symmetric, no curvature, ROM normal, no CVA tenderness  Lungs:     Clear to auscultation bilaterally, respirations unlabored  Chest wall:    No tenderness or deformity  Heart:    Regular rate and rhythm, S1 and S2 normal, no murmur, rub   or gallop  Abdomen:     Soft, non-tender, bowel sounds active all four quadrants,    no masses, no organomegaly    Genitalia:    Normal male without lesion, discharge or tenderness  Rectal:    Normal tone, normal prostate, no masses or tenderness  Extremities:   Extremities normal, atraumatic, no cyanosis or edema  Pulses:   2+ and symmetric all extremities  Skin:   Skin color, texture, turgor normal, no rashes or lesions  Lymph nodes:   Cervical, supraclavicular, and axillary nodes normal  Neurologic:   CNII-XII intact. Normal strength, sensation and reflexes      throughout          Assessment & Plan:

## 2015-12-22 NOTE — Patient Instructions (Signed)
Follow up in 6 months to recheck cholesterol We'll notify you of your lab results and make any changes if needed Keep up the good work on healthy diet and regular exercise- you look great! You are due for colonoscopy next year Call with any questions or concerns Happy Fall!!!

## 2015-12-23 LAB — TSH: TSH: 3.18 u[IU]/mL (ref 0.35–4.50)

## 2015-12-23 LAB — PSA: PSA: 1.96 ng/mL (ref 0.10–4.00)

## 2016-01-12 ENCOUNTER — Encounter: Payer: Self-pay | Admitting: Family Medicine

## 2016-01-12 ENCOUNTER — Ambulatory Visit (INDEPENDENT_AMBULATORY_CARE_PROVIDER_SITE_OTHER): Payer: BLUE CROSS/BLUE SHIELD | Admitting: Family Medicine

## 2016-01-12 VITALS — BP 118/83 | HR 84 | Temp 98.7°F | Resp 16 | Ht 70.0 in | Wt 197.0 lb

## 2016-01-12 DIAGNOSIS — L570 Actinic keratosis: Secondary | ICD-10-CM | POA: Diagnosis not present

## 2016-01-12 DIAGNOSIS — D485 Neoplasm of uncertain behavior of skin: Secondary | ICD-10-CM | POA: Diagnosis not present

## 2016-01-12 DIAGNOSIS — L821 Other seborrheic keratosis: Secondary | ICD-10-CM | POA: Diagnosis not present

## 2016-01-12 DIAGNOSIS — D045 Carcinoma in situ of skin of trunk: Secondary | ICD-10-CM | POA: Diagnosis not present

## 2016-01-12 DIAGNOSIS — M7712 Lateral epicondylitis, left elbow: Secondary | ICD-10-CM

## 2016-01-12 MED ORDER — PREDNISONE 10 MG PO TABS: ORAL_TABLET | ORAL | 0 refills | Status: DC

## 2016-01-12 NOTE — Progress Notes (Signed)
Pre visit review using our clinic review tool, if applicable. No additional management support is needed unless otherwise documented below in the visit note. 

## 2016-01-12 NOTE — Patient Instructions (Signed)
Follow up as scheduled This is lateral epicondylitis- inflammation of the elbow ICE! Take the Prednisone as directed- take w/ food Call with any questions or concerns Enjoy the beach!!!

## 2016-01-12 NOTE — Progress Notes (Signed)
   Subjective:    Patient ID: LAMOUNT POPA, male    DOB: 08/15/1955, 60 y.o.   MRN: [id number]  HPI L elbow pain- pt reports pain has worsened over the past few weeks.  Has increased his golfing.  Pain will improve w/ ice.  Pain w/ lifting objects.  No difficulty w/ door knobs.  No daily repetitive motions.  Able to flex and extend elbow w/o difficulty.   Review of Systems For ROS see HPI     Objective:   Physical Exam  Constitutional: He is oriented to person, place, and time. He appears well-developed and well-nourished. No distress.  HENT:  Head: Normocephalic and atraumatic.  Cardiovascular: Intact distal pulses.   Musculoskeletal: He exhibits tenderness (TTP over L lateral epicondyle). He exhibits no edema or deformity.  Neurological: He is alert and oriented to person, place, and time. He has normal reflexes.  Skin: Skin is warm and dry. No erythema.  Vitals reviewed.         Assessment & Plan:  L lateral epicondylitis- new.  Pt's sxs and PE consistent w/ inflammation.  Start Prednisone as directed.  Ice.  Reviewed supportive care and red flags that should prompt return.  Pt expressed understanding and is in agreement w/ plan.

## 2016-01-21 ENCOUNTER — Telehealth: Payer: Self-pay | Admitting: Family Medicine

## 2016-01-21 DIAGNOSIS — M7711 Lateral epicondylitis, right elbow: Secondary | ICD-10-CM

## 2016-01-21 MED ORDER — PREDNISONE 10 MG PO TABS: ORAL_TABLET | ORAL | 0 refills | Status: DC

## 2016-01-21 NOTE — Telephone Encounter (Signed)
Spoke with pt and he is in agreement with the sports med referral, placed as urgent as pt is leaving on Tuesday .

## 2016-01-21 NOTE — Telephone Encounter (Signed)
Patient returned called to report he did receive a call from sports med to schedule appt.  However, the soonest they can get him in is a week from today.  Patient states he is going out of town on Tuesday and if he can not be seen before he leaves he would like to have another prescription for prednisone sent to pharmacy until he can get back in town and get in with sports med.    Note:  I called several other offices to see if patient could be seen today or Monday and no one has any availably before Tuesday.

## 2016-01-21 NOTE — Telephone Encounter (Signed)
Please refer to Sports Med as he may need an injxn for his lateral epicondylitis

## 2016-01-21 NOTE — Telephone Encounter (Signed)
Patient calling to report he was seen in office last week for tennis elbow.  He reports he began taking the prednisone as prescribed and after about day 2 the pain went away.  However, now patient reports pain has returned and it seems to be much worse.  Patient seeking advise on what he should do.  He is leaving to go out of town on Tuesday and would like to have some relief before trip. Pharmacy:  [facility]

## 2016-01-21 NOTE — Telephone Encounter (Signed)
[facility] for refill on Prednisone until he can return for sports med appt

## 2016-01-21 NOTE — Telephone Encounter (Signed)
Patient notified of PCP recommendations and is agreement and expresses an understanding.  

## 2016-01-28 ENCOUNTER — Other Ambulatory Visit: Payer: Self-pay | Admitting: Family Medicine

## 2016-02-03 DIAGNOSIS — D045 Carcinoma in situ of skin of trunk: Secondary | ICD-10-CM | POA: Diagnosis not present

## 2016-02-07 ENCOUNTER — Telehealth: Payer: Self-pay | Admitting: Family Medicine

## 2016-02-07 NOTE — Telephone Encounter (Signed)
Pt states that he is back in town and would like referral to Ortho. Can we still use that one from before?

## 2016-02-07 NOTE — Telephone Encounter (Signed)
Yes, ok to use that referral that is open.

## 2016-02-10 ENCOUNTER — Encounter: Payer: Self-pay | Admitting: Family Medicine

## 2016-02-10 ENCOUNTER — Telehealth: Payer: Self-pay | Admitting: Family Medicine

## 2016-02-10 ENCOUNTER — Ambulatory Visit (INDEPENDENT_AMBULATORY_CARE_PROVIDER_SITE_OTHER): Payer: BLUE CROSS/BLUE SHIELD | Admitting: Family Medicine

## 2016-02-10 DIAGNOSIS — M7712 Lateral epicondylitis, left elbow: Secondary | ICD-10-CM | POA: Diagnosis not present

## 2016-02-10 MED ORDER — NITROGLYCERIN 0.2 MG/HR TD PT24: MEDICATED_PATCH | TRANSDERMAL | 1 refills | Status: DC

## 2016-02-10 NOTE — Telephone Encounter (Signed)
Sent this in - please let him know.  Thanks!

## 2016-02-10 NOTE — Patient Instructions (Signed)
You have lateral epicondylitis Try to avoid painful activities as much as possible. Ice the area 3-4 times a day for 15 minutes at a time. Tylenol and ibuprofen as needed for pain. Counterforce brace as directed can help unload area - wear this regularly if it provides you with relief. Hammer rotation exercise, wrist extension exercise with 1 pound weight - 3 sets of 10 once a day.   Stretching - hold for 20-30 seconds and repeat 3 times. Wrist brace will help eliminate one of the motions that can aggravate this - consider wearing while sleeping also. Consider physical therapy, injection, nitro patches if not improving. Follow up in 6 weeks.

## 2016-02-13 DIAGNOSIS — M7712 Lateral epicondylitis, left elbow: Secondary | ICD-10-CM | POA: Insufficient documentation

## 2016-02-13 NOTE — Progress Notes (Signed)
PCP and consultation requested by: Annye Asa, MD  Subjective:   HPI: Patient is a 60 y.o. male here for left elbow pain.  Patient reports he's had left lateral elbow pain for about 3 1/2 weeks. Was playing more golf prior to this starting. Difficulty doing this and had to stop. Pain is 2/10 currently but can be up to 10/10 level, sharp. He is right handed Taking ibuprofen, tylenol, icing now. Worse picking items up. Tried a counterforce brace, sleeve. Prednisone dose pack caused 80% improvement but pain recurred No skin changes, numbness.  Past Medical History:  Diagnosis Date  . GERD (gastroesophageal reflux disease)   . Hyperlipidemia   . Rib fracture     Current Outpatient Prescriptions on File Prior to Visit  Medication Sig Dispense Refill  . acetaminophen (TYLENOL) 500 MG tablet Take 500 mg by mouth as needed.    Marland Kitchen aspirin 81 MG tablet Take 81 mg by mouth daily.    Marland Kitchen atorvastatin (LIPITOR) 10 MG tablet TAKE 1 TABLET BY MOUTH AT BEDTIME 90 tablet 1  . Cholecalciferol 1000 UNITS capsule Take 2,000 Units by mouth daily.    Marland Kitchen ibuprofen (ADVIL,MOTRIN) 200 MG tablet Take 600 mg by mouth daily as needed.     Marland Kitchen KRILL OIL PO Take 2 tablets by mouth daily.    . Multiple Vitamin (MULTIVITAMIN) tablet Take 1 tablet by mouth daily.    . NON FORMULARY [employer] tri-flex (turmeric, glucosamine and chondroitin)    . predniSONE (DELTASONE) 10 MG tablet 3 tabs x3 days and then 2 tabs x3 days and then 1 tab x3 days.  Take w/ food. 18 tablet 0  . zolpidem (AMBIEN) 10 MG tablet Take 1 tablet (10 mg total) by mouth at bedtime as needed. for sleep 30 tablet 1   No current facility-administered medications on file prior to visit.     Past Surgical History:  Procedure Laterality Date  . TOTAL KNEE ARTHROPLASTY  2006    No Known Allergies  Social History   Social History  . Marital status: Married    Spouse name: N/A  . Number of children: 1  . Years of education: N/A    Occupational History  . facility manager    Social History Main Topics  . Smoking status: Former Smoker    Quit date: 04/17/1977  . Smokeless tobacco: Never Used  . Alcohol use 1.2 oz/week    2 Glasses of wine per week  . Drug use: Unknown  . Sexual activity: Not on file   Other Topics Concern  . Not on file   Social History Narrative  . No narrative on file    Family History  Problem Relation Age of Onset  . Coronary artery disease Brother   . Dementia Mother   . Pancreatitis Father     BP (!) 158/96   Pulse 71   Ht 5\' 10"  (1.778 m)   Wt 195 lb (88.5 kg)   BMI 27.98 kg/m   Review of Systems: See HPI above.    Objective:  Physical Exam:  Gen: NAD, comfortable in exam room  Left elbow: No gross deformity swelling or bruising. TTP lateral epicondyle.  No other tenderness. FROM elbow and wrist.  Pain on resisted wrist extension and 3rd digit extension. Collateral ligaments intact. NVI distally.  Right elbow: FROM without pain.    Assessment & Plan:  1. Left lateral epicondylitis - shown home exercises to do daily.  Icing, tylenol or ibuprofen if needed.  Counterforce brace.  Encouraged wrist brace.  He will consider physical therapy, nitro patches, injection as well - will call us if he would like to proceed with any of these.  F/u in 6 weeks.

## 2016-02-13 NOTE — Assessment & Plan Note (Signed)
shown home exercises to do daily.  Icing, tylenol or ibuprofen if needed.  Counterforce brace.  Encouraged wrist brace.  He will consider physical therapy, nitro patches, injection as well - will call us if he would like to proceed with any of these.  F/u in 6 weeks.

## 2016-02-14 ENCOUNTER — Ambulatory Visit: Payer: BLUE CROSS/BLUE SHIELD | Admitting: Family Medicine

## 2016-03-29 ENCOUNTER — Telehealth: Payer: Self-pay | Admitting: Family Medicine

## 2016-03-29 NOTE — Telephone Encounter (Signed)
I would hope he's at least 50% better by now.  If he is I'd encourage him to continue with the patches for 6 more weeks and the home exercises then see him back around that time.

## 2016-03-29 NOTE — Telephone Encounter (Signed)
He did exercises, used Nitro patches. Most tenderness is gone except for the joint area and there is a small bruise. on left elbow.  How can he get rid of the rest of the tenderness?  Does he need more time for Nitro to work?  what can he do and/or should he come in for appointment? thanks

## 2016-03-29 NOTE — Telephone Encounter (Signed)
Spoke with patient and told him to continue with the patches and home exercises for 6 more weeks and make an appointment to come to the office around 6 weeks from now.

## 2016-06-21 ENCOUNTER — Ambulatory Visit: Payer: BLUE CROSS/BLUE SHIELD | Admitting: Family Medicine

## 2016-06-23 ENCOUNTER — Ambulatory Visit (INDEPENDENT_AMBULATORY_CARE_PROVIDER_SITE_OTHER): Payer: BLUE CROSS/BLUE SHIELD | Admitting: Physician Assistant

## 2016-06-23 ENCOUNTER — Ambulatory Visit: Payer: BLUE CROSS/BLUE SHIELD | Admitting: Family Medicine

## 2016-06-23 ENCOUNTER — Encounter: Payer: Self-pay | Admitting: Physician Assistant

## 2016-06-23 VITALS — BP 120/70 | HR 67 | Temp 98.8°F | Resp 14 | Ht 70.0 in | Wt 192.0 lb

## 2016-06-23 DIAGNOSIS — G8929 Other chronic pain: Secondary | ICD-10-CM | POA: Diagnosis not present

## 2016-06-23 DIAGNOSIS — M25562 Pain in left knee: Secondary | ICD-10-CM | POA: Diagnosis not present

## 2016-06-23 DIAGNOSIS — E785 Hyperlipidemia, unspecified: Secondary | ICD-10-CM | POA: Diagnosis not present

## 2016-06-23 LAB — LIPID PANEL
Cholesterol: 200 mg/dL (ref 0–200)
HDL: 82.1 mg/dL (ref >39.00)
LDL Cholesterol: 107 mg/dL — ABNORMAL HIGH (ref 0–99)
NonHDL: 117.77
Total CHOL/HDL Ratio: 2
Triglycerides: 52 mg/dL (ref 0.0–149.0)
VLDL: 10.4 mg/dL (ref 0.0–40.0)

## 2016-06-23 LAB — HEPATIC FUNCTION PANEL
ALT: 20 U/L (ref 0–53)
AST: 23 U/L (ref 0–37)
Albumin: 4.7 g/dL (ref 3.5–5.2)
Alkaline Phosphatase: 44 U/L (ref 39–117)
Bilirubin, Direct: 0.1 mg/dL (ref 0.0–0.3)
Total Bilirubin: 0.5 mg/dL (ref 0.2–1.2)
Total Protein: 7.4 g/dL (ref 6.0–8.3)

## 2016-06-23 MED ORDER — ZOLPIDEM TARTRATE 10 MG PO TABS: 10.0000 mg | ORAL_TABLET | Freq: Every evening | ORAL | 0 refills | Status: DC | PRN

## 2016-06-23 NOTE — Patient Instructions (Signed)
Please go to the lab for blood work. I will call you with your results.  Please continue medications as directed.  Please keep knee elevated while resting. Ice.  Wear your knee brace daily. Cut back on the running over the next week. We may need repeat imaging or follow-up with Ortho if things are not improving.

## 2016-06-23 NOTE — Progress Notes (Signed)
Pre visit review using our clinic review tool, if applicable. No additional management support is needed unless otherwise documented below in the visit note. 

## 2016-06-23 NOTE — Progress Notes (Signed)
Patient presents to clinic today for follow-up of hyperlipidemia. Patient is currently on 81 mg mg ASA and atorvastatin 10- g daily. Endorses taking all medications as directed. Patient denies chest pain, palpitations, lightheadedness, dizziness, vision changes or frequent headaches. Is due for FU with Cardiology giving family history of significant CAD.  Patient endorses pain in L knee over the past several months, worsening over the past month. Endorses history of significant OA. Has prior history of arthroscopic repair of meniscus byDr. Jenness [place]. Endorses decreased flexibility over the past year. Notes occasional swelling. Pressure in the knee. No pain. Denies redness or warmth. Walking/jogging several miles per day.  Has noted harder time with running -- less duration and frequency. Is stretching before exercise. Denies buckling.   Past Medical History:  Diagnosis Date  . GERD (gastroesophageal reflux disease)   . Hyperlipidemia   . Rib fracture     Current Outpatient Prescriptions on File Prior to Visit  Medication Sig Dispense Refill  . acetaminophen (TYLENOL) 500 MG tablet Take 500 mg by mouth as needed.    Marland Kitchen aspirin 81 MG tablet Take 81 mg by mouth daily.    Marland Kitchen atorvastatin (LIPITOR) 10 MG tablet TAKE 1 TABLET BY MOUTH AT BEDTIME 90 tablet 1  . Cholecalciferol 1000 UNITS capsule Take 2,000 Units by mouth daily.    Marland Kitchen ibuprofen (ADVIL,MOTRIN) 200 MG tablet Take 600 mg by mouth daily as needed.     Marland Kitchen KRILL OIL PO Take 2 tablets by mouth daily.    . Multiple Vitamin (MULTIVITAMIN) tablet Take 1 tablet by mouth daily.    . nitroGLYCERIN (NITRODUR - DOSED IN MG/24 HR) 0.2 mg/hr patch Apply 1/4th patch to affected elbow, change daily 30 patch 1  . zolpidem (AMBIEN) 10 MG tablet Take 1 tablet (10 mg total) by mouth at bedtime as needed. for sleep 30 tablet 1   No current facility-administered medications on file prior to visit.     No Known Allergies  Family History  Problem Relation Age  of Onset  . Coronary artery disease Brother   . Dementia Mother   . Pancreatitis Father     Social History   Social History  . Marital status: Married    Spouse name: N/A  . Number of children: 1  . Years of education: N/A   Occupational History  . facility manager    Social History Main Topics  . Smoking status: Former Smoker    Quit date: 04/17/1977  . Smokeless tobacco: Never Used  . Alcohol use 1.2 oz/week    2 Glasses of wine per week  . Drug use: Unknown  . Sexual activity: Not Asked   Other Topics Concern  . None   Social History Narrative  . None    Review of Systems - See HPI.  All other ROS are negative.  BP 120/70   Pulse 67   Temp 98.8 F (37.1 C) (Oral)   Resp 14   Ht 5\' 10"  (1.778 m)   Wt 192 lb (87.1 kg)   SpO2 98%   BMI 27.55 kg/m   Physical Exam  Constitutional: He is well-developed, well-nourished, and in no distress. No distress.  Neck: Neck supple.  Cardiovascular: Normal rate and normal heart sounds.  Exam reveals no gallop and no friction rub.   No murmur heard. Pulmonary/Chest: Effort normal and breath sounds normal. No respiratory distress. He has no wheezes. He has no rales.  Musculoskeletal:       Right knee:  He exhibits normal range of motion, no swelling, no effusion, no ecchymosis and no erythema. No tenderness found.       Left knee: He exhibits decreased range of motion (with flexion. 5/5 strength with both flexion and extension.) and swelling. He exhibits no effusion, no ecchymosis and no erythema. No tenderness found.  Lymphadenopathy:    He has no cervical adenopathy.  Skin: He is not diaphoretic.   Assessment/Plan: 1. Hyperlipidemia, unspecified hyperlipidemia type Fasting lipids and LFTs today. Will alter regimen based on results. Dietary and exercise recommendations reviewed. - Hepatic function panel - Lipid panel; Future - Lipid panel  2. Chronic pain of left knee Chronic OA with prior arthroscopy for meniscal  tear. No trauma or injury. Suspect overuse coupled with OA as he has continued running several miles per day despite pain and intermittent swelling. RICE. Pain medication reviewed. Start knee bracing daily. No running over the next 10-14 days. FU for reassessment. If not improving will need repeat Ortho assessment.    Leeanne Rio, PA-C

## 2016-07-23 ENCOUNTER — Other Ambulatory Visit: Payer: Self-pay | Admitting: Family Medicine

## 2016-08-06 ENCOUNTER — Other Ambulatory Visit: Payer: Self-pay | Admitting: Family Medicine

## 2016-09-19 ENCOUNTER — Ambulatory Visit (INDEPENDENT_AMBULATORY_CARE_PROVIDER_SITE_OTHER): Payer: BLUE CROSS/BLUE SHIELD | Admitting: Physician Assistant

## 2016-09-19 ENCOUNTER — Encounter: Payer: Self-pay | Admitting: Physician Assistant

## 2016-09-19 VITALS — BP 160/90 | HR 57 | Temp 98.5°F | Ht 70.0 in | Wt 203.0 lb

## 2016-09-19 DIAGNOSIS — H6122 Impacted cerumen, left ear: Secondary | ICD-10-CM

## 2016-09-19 DIAGNOSIS — H6993 Unspecified Eustachian tube disorder, bilateral: Secondary | ICD-10-CM

## 2016-09-19 DIAGNOSIS — H8110 Benign paroxysmal vertigo, unspecified ear: Secondary | ICD-10-CM | POA: Diagnosis not present

## 2016-09-19 DIAGNOSIS — H6983 Other specified disorders of Eustachian tube, bilateral: Secondary | ICD-10-CM | POA: Diagnosis not present

## 2016-09-19 DIAGNOSIS — J302 Other seasonal allergic rhinitis: Secondary | ICD-10-CM | POA: Diagnosis not present

## 2016-09-19 DIAGNOSIS — R42 Dizziness and giddiness: Secondary | ICD-10-CM

## 2016-09-19 MED ORDER — MECLIZINE HCL 25 MG PO TABS: 25.0000 mg | ORAL_TABLET | Freq: Three times a day (TID) | ORAL | 0 refills | Status: DC | PRN

## 2016-09-19 MED ORDER — FLUTICASONE PROPIONATE 50 MCG/ACT NA SUSP: 2.0000 | Freq: Every day | NASAL | 1 refills | Status: DC

## 2016-09-19 NOTE — Patient Instructions (Addendum)
Start Flonase daily and Zyrtec daily.  Take Meclizine as needed for your vertigo.  If you develop any new or unusual symptoms (slurred speech, fever, weakness on 1 side of the body) please seek medical attention immediately.

## 2016-09-19 NOTE — Progress Notes (Signed)
Martin Malone is a 61 y.o. male here for dizziness and ear fullness.  I acted as a Education administrator for [employer], PA-C Anselmo Pickler, LPN  History of Present Illness:   Chief Complaint  Patient presents with  . Dizziness  . Bilateral ear pressure    Dizziness  This is a new problem. The current episode started in the past 7 days. The problem occurs constantly (past 2-3 days). The problem has been gradually worsening. Associated symptoms include congestion, coughing, joint swelling, vertigo and a visual change. Pertinent negatives include no abdominal pain, change in bowel habit, chest pain, chills, diaphoresis, fatigue, fever, headaches, nausea, rash, sore throat, swollen glands, urinary symptoms, vomiting or weakness. Associated symptoms comments: Fingers stiff in the morning. Blurred vision when first wakes up but then clears. The symptoms are aggravated by bending and twisting. He has tried acetaminophen and NSAIDs for the symptoms. The treatment provided no relief.  Ear Fullness   There is pain in both ears. This is a new problem. Episode onset: x 2/5 days, greater on left then right. The problem occurs constantly. The problem has been gradually worsening. There has been no fever. The pain is at a severity of 0/10. The patient is experiencing no pain. Associated symptoms include coughing. Pertinent negatives include no abdominal pain, headaches, rash, sore throat or vomiting. Associated symptoms comments: Muffled sounds. Treatments tried: chewing bubble gum, ears popping off and on, saline spray and zyrtec. The treatment provided mild relief. Seasonal allergies   Dizziness has gotten worse since yesterday. Feels pressure sensation. Has not felt pre-syncopal. Has had vertigo several years ago that felt very similar to this. Coughing up green phlegm. No fevers. Martin Malone is sick with a cold. No weakness on one-sided of the body, hx of stroke, slurred speech, issues with balance. No neck  stiffness.   PMHx, SurgHx, SocialHx, Medications, and Allergies were reviewed in the Visit Navigator and updated as appropriate.  Current Medications:   Current Outpatient Prescriptions:  .  acetaminophen (TYLENOL) 500 MG tablet, Take 500 mg by mouth as needed., Disp: , Rfl:  .  aspirin 81 MG tablet, Take 81 mg by mouth daily., Disp: , Rfl:  .  atorvastatin (LIPITOR) 10 MG tablet, TAKE 1 TABLET BY MOUTH AT BEDTIME, Disp: 90 tablet, Rfl: 1 .  cetirizine (ZYRTEC) 10 MG tablet, Take 10 mg by mouth daily., Disp: , Rfl:  .  Cholecalciferol 1000 UNITS capsule, Take 2,000 Units by mouth daily., Disp: , Rfl:  .  ibuprofen (ADVIL,MOTRIN) 200 MG tablet, Take 600 mg by mouth daily as needed. , Disp: , Rfl:  .  KRILL OIL PO, Take 2 tablets by mouth daily., Disp: , Rfl:  .  Misc Natural Products (GLUCOSAMINE CHONDROITIN TRIPLE PO), Take 2 tablets by mouth daily., Disp: , Rfl:  .  Multiple Vitamin (MULTIVITAMIN) tablet, Take 1 tablet by mouth daily., Disp: , Rfl:  .  nitroGLYCERIN (NITRODUR - DOSED IN MG/24 HR) 0.2 mg/hr patch, Apply 1/4th patch to affected elbow, change daily, Disp: 30 patch, Rfl: 1 .  zolpidem (AMBIEN) 10 MG tablet, Take 1 tablet (10 mg total) by mouth at bedtime as needed. for sleep, Disp: 30 tablet, Rfl: 0 .  fluticasone (FLONASE) 50 MCG/ACT nasal spray, Place 2 sprays into both nostrils daily., Disp: 16 g, Rfl: 1 .  meclizine (ANTIVERT) 25 MG tablet, Take 1 tablet (25 mg total) by mouth 3 (three) times daily as needed for dizziness., Disp: 30 tablet, Rfl: 0   Review of  Systems:   Review of Systems  Constitutional: Negative for chills, diaphoresis, fatigue and fever.  HENT: Positive for congestion. Negative for sore throat.   Respiratory: Positive for cough.   Cardiovascular: Negative for chest pain.  Gastrointestinal: Negative for abdominal pain, change in bowel habit, nausea and vomiting.  Musculoskeletal: Positive for joint swelling.  Skin: Negative for rash.  Neurological:  Positive for dizziness and vertigo. Negative for weakness and headaches.    Vitals:   Vitals:   09/19/16 1133  BP: (!) 160/90  Pulse: (!) 57  Temp: 98.5 F (36.9 C)  TempSrc: Oral  SpO2: 97%  Weight: 203 lb (92.1 kg)  Height: 5\' 10"  (1.778 m)     Body mass index is 29.13 kg/m.  Physical Exam:   Physical Exam  Constitutional: He appears well-developed. He is cooperative.  Non-toxic appearance. He does not have a sickly appearance. He does not appear ill. No distress.  HENT:  Head: Normocephalic and atraumatic.  Right Ear: Tympanic membrane, external ear and ear canal normal. Tympanic membrane is not erythematous, not retracted and not bulging.  Left Ear: External ear and ear canal normal. Tympanic membrane is not erythematous, not retracted and not bulging.  Nose: Nose normal. Right sinus exhibits no maxillary sinus tenderness and no frontal sinus tenderness. Left sinus exhibits no maxillary sinus tenderness and no frontal sinus tenderness.  Mouth/Throat: Uvula is midline. No posterior oropharyngeal edema or posterior oropharyngeal erythema.  Prior to ear lavage, L ear with cerumen impaction and unable to visualize TM. After ear lavage, TM easily visualized and without erythema.   Eyes: Conjunctivae and lids are normal. Right eye exhibits discharge. Left eye exhibits discharge.  Bilateral eyes with clear discharge  Neck: Trachea normal.  Cardiovascular: Normal rate, regular rhythm, S1 normal, S2 normal and normal heart sounds.   Pulmonary/Chest: Effort normal and breath sounds normal. He has no decreased breath sounds. He has no wheezes. He has no rhonchi. He has no rales.  Lymphadenopathy:    He has no cervical adenopathy.  Neurological: He is alert. He has normal strength. No cranial nerve deficit or sensory deficit. GCS eye subscore is 4. GCS verbal subscore is 5. GCS motor subscore is 6.  Skin: Skin is warm, dry and intact.  Psychiatric: He has a normal mood and affect. His  speech is normal and behavior is normal.  Nursing note and vitals reviewed.  Cerumen was removed using gentle irrigation by CMA. Tympanic membranes are intact following the procedure.  Auditory canals are normal. Patient tolerated procedure well.  Assessment and Plan:    Mace was seen today for dizziness and bilateral ear pressure.  Diagnoses and all orders for this visit:  Dizziness  Impacted cerumen of left ear  Eustachian tube dysfunction, bilateral  Seasonal allergic rhinitis, unspecified trigger  Other orders -     fluticasone (FLONASE) 50 MCG/ACT nasal spray; Place 2 sprays into both nostrils daily. -     meclizine (ANTIVERT) 25 MG tablet; Take 1 tablet (25 mg total) by mouth 3 (three) times daily as needed for dizziness.   Neuro exam benign. Patient had improvement of hearing and symptoms after ear lavage. Discussed use of flonase and zyrtec regularly to help with eustachian tube dysfunction and allergy symptoms. Patient provided with prescription for meclizine prn. No indication for antibiotics at this time. Provided patient with list of red flags and warning signs of when to seek medical attention. Patient is agreeable to plan.   . Reviewed expectations re: course  of current medical issues. . Discussed self-management of symptoms. . Outlined signs and symptoms indicating need for more acute intervention. . Patient verbalized understanding and all questions were answered. . See orders for this visit as documented in the electronic medical record. . Patient received an After-Visit Summary.  CMA or LPN served as scribe during this visit. History, Physical, and Plan performed by medical provider. Documentation and orders reviewed and attested to.  Inda Coke, PA-C

## 2016-09-25 ENCOUNTER — Encounter: Payer: Self-pay | Admitting: Family Medicine

## 2016-09-25 ENCOUNTER — Ambulatory Visit (INDEPENDENT_AMBULATORY_CARE_PROVIDER_SITE_OTHER): Payer: BLUE CROSS/BLUE SHIELD | Admitting: Family Medicine

## 2016-09-25 VITALS — BP 120/82 | HR 79 | Temp 98.9°F | Resp 14 | Ht 70.0 in | Wt 198.0 lb

## 2016-09-25 DIAGNOSIS — H8110 Benign paroxysmal vertigo, unspecified ear: Secondary | ICD-10-CM | POA: Diagnosis not present

## 2016-09-25 MED ORDER — MECLIZINE HCL 25 MG PO TABS: ORAL_TABLET | ORAL | 0 refills | Status: DC

## 2016-09-25 NOTE — Patient Instructions (Signed)
Follow up as needed/scheduled Increase the Meclizine to 2 tabs up to 3x/day as needed for dizziness Do the exercises as directed Drink plenty of fluids Call with any questions or concerns- particularly if not improving or worsening Have a great time at the beach!!!

## 2016-09-25 NOTE — Assessment & Plan Note (Signed)
Deteriorated.  Pt has not had any relief w/ the 25mg  TID of Meclizine.  Will increase to 50mg  TID PRN.  Reviewed Modified Epley maneuver w/ pt and how to do it correctly.  If no improvement, will need to do vestibular rehab.  Reviewed supportive care and red flags that should prompt return.  Pt expressed understanding and is in agreement w/ plan.

## 2016-09-25 NOTE — Progress Notes (Signed)
   Subjective:    Patient ID: Martin Malone, male    DOB: 27-Nov-1955, 61 y.o.   MRN: [id number]  HPI Dizziness- pt was seen 6/5 for similar.  Pt reports he continues to have dizziness w/ turning his head, bending over, rolling over in bed.  Pt reports 1 side is worse than the other.  No sinus pain or pressure.  Using Flonase and Zyrtec daily.  Minimal improvement w/ Meclizine.  Hx of similar.  Pt has been doing some exercises that he Googled but doesn't think he's been doing them properly.   Review of Systems For ROS see HPI     Objective:   Physical Exam  Constitutional: He is oriented to person, place, and time. He appears well-developed and well-nourished. No distress.  HENT:  Head: Normocephalic and atraumatic.  Right Ear: External ear normal.  Left Ear: External ear normal.  Nose: Nose normal.  Mouth/Throat: Oropharynx is clear and moist. No oropharyngeal exudate.  Eyes: Conjunctivae and EOM are normal. Pupils are equal, round, and reactive to light.  Neck: Normal range of motion. Neck supple. No thyromegaly present.  Cardiovascular: Normal rate, regular rhythm, normal heart sounds and intact distal pulses.   Pulmonary/Chest: Effort normal and breath sounds normal. No respiratory distress. He has no wheezes. He has no rales.  Lymphadenopathy:    He has no cervical adenopathy.  Neurological: He is alert and oriented to person, place, and time. He has normal reflexes. No cranial nerve deficit. Coordination normal.  2-3 beats of horizontal nystagmus, R>L  Skin: Skin is warm and dry.  Psychiatric: He has a normal mood and affect. His behavior is normal.  Vitals reviewed.         Assessment & Plan:

## 2016-09-25 NOTE — Addendum Note (Signed)
Addended by: Katina Dung on: 09/25/2016 03:54 PM   Modules accepted: Orders

## 2016-09-25 NOTE — Progress Notes (Signed)
Pre visit review using our clinic review tool, if applicable. No additional management support is needed unless otherwise documented below in the visit note. 

## 2016-11-30 DIAGNOSIS — H5203 Hypermetropia, bilateral: Secondary | ICD-10-CM | POA: Diagnosis not present

## 2016-11-30 DIAGNOSIS — H52223 Regular astigmatism, bilateral: Secondary | ICD-10-CM | POA: Diagnosis not present

## 2016-11-30 DIAGNOSIS — H524 Presbyopia: Secondary | ICD-10-CM | POA: Diagnosis not present

## 2016-12-27 LAB — HM COLONOSCOPY

## 2016-12-28 ENCOUNTER — Ambulatory Visit (INDEPENDENT_AMBULATORY_CARE_PROVIDER_SITE_OTHER): Payer: BLUE CROSS/BLUE SHIELD | Admitting: Family Medicine

## 2016-12-28 ENCOUNTER — Encounter: Payer: Self-pay | Admitting: General Practice

## 2016-12-28 ENCOUNTER — Encounter: Payer: Self-pay | Admitting: Family Medicine

## 2016-12-28 VITALS — BP 120/80 | HR 74 | Temp 98.4°F | Resp 16 | Ht 70.0 in | Wt 201.0 lb

## 2016-12-28 DIAGNOSIS — Z23 Encounter for immunization: Secondary | ICD-10-CM

## 2016-12-28 DIAGNOSIS — Z0000 Encounter for general adult medical examination without abnormal findings: Secondary | ICD-10-CM | POA: Diagnosis not present

## 2016-12-28 DIAGNOSIS — H8110 Benign paroxysmal vertigo, unspecified ear: Secondary | ICD-10-CM

## 2016-12-28 DIAGNOSIS — E785 Hyperlipidemia, unspecified: Secondary | ICD-10-CM

## 2016-12-28 DIAGNOSIS — Z125 Encounter for screening for malignant neoplasm of prostate: Secondary | ICD-10-CM

## 2016-12-28 LAB — CBC WITH DIFFERENTIAL/PLATELET
Basophils Absolute: 0.1 10*3/uL (ref 0.0–0.1)
Basophils Relative: 0.6 % (ref 0.0–3.0)
Eosinophils Absolute: 0.2 10*3/uL (ref 0.0–0.7)
Eosinophils Relative: 2 % (ref 0.0–5.0)
HCT: 44.1 % (ref 39.0–52.0)
Hemoglobin: 14.4 g/dL (ref 13.0–17.0)
Lymphocytes Relative: 23.1 % (ref 12.0–46.0)
Lymphs Abs: 2.3 10*3/uL (ref 0.7–4.0)
MCHC: 32.7 g/dL (ref 30.0–36.0)
MCV: 99.5 fl (ref 78.0–100.0)
Monocytes Absolute: 0.8 10*3/uL (ref 0.1–1.0)
Monocytes Relative: 8.5 % (ref 3.0–12.0)
Neutro Abs: 6.4 10*3/uL (ref 1.4–7.7)
Neutrophils Relative %: 65.8 % (ref 43.0–77.0)
Platelets: 291 10*3/uL (ref 150.0–400.0)
RBC: 4.44 Mil/uL (ref 4.22–5.81)
RDW: 14.6 % (ref 11.5–15.5)
WBC: 9.8 10*3/uL (ref 4.0–10.5)

## 2016-12-28 LAB — BASIC METABOLIC PANEL
BUN: 15 mg/dL (ref 6–23)
CO2: 28 mEq/L (ref 19–32)
Calcium: 9.9 mg/dL (ref 8.4–10.5)
Chloride: 103 mEq/L (ref 96–112)
Creatinine, Ser: 1.14 mg/dL (ref 0.40–1.50)
GFR: 69.15 mL/min (ref >60.00)
Glucose, Bld: 90 mg/dL (ref 70–99)
Potassium: 5.2 mEq/L — ABNORMAL HIGH (ref 3.5–5.1)
Sodium: 139 mEq/L (ref 135–145)

## 2016-12-28 LAB — HEPATIC FUNCTION PANEL
ALT: 22 U/L (ref 0–53)
AST: 23 U/L (ref 0–37)
Albumin: 4.6 g/dL (ref 3.5–5.2)
Alkaline Phosphatase: 49 U/L (ref 39–117)
Bilirubin, Direct: 0.1 mg/dL (ref 0.0–0.3)
Total Bilirubin: 0.6 mg/dL (ref 0.2–1.2)
Total Protein: 7.3 g/dL (ref 6.0–8.3)

## 2016-12-28 LAB — LIPID PANEL
Cholesterol: 208 mg/dL — ABNORMAL HIGH (ref 0–200)
HDL: 84.2 mg/dL (ref >39.00)
LDL Cholesterol: 103 mg/dL — ABNORMAL HIGH (ref 0–99)
NonHDL: 124.1
Total CHOL/HDL Ratio: 2
Triglycerides: 107 mg/dL (ref 0.0–149.0)
VLDL: 21.4 mg/dL (ref 0.0–40.0)

## 2016-12-28 LAB — PSA: PSA: 1.9 ng/mL (ref 0.10–4.00)

## 2016-12-28 LAB — TSH: TSH: 2.09 u[IU]/mL (ref 0.35–4.50)

## 2016-12-28 MED ORDER — ZOLPIDEM TARTRATE 10 MG PO TABS: 10.0000 mg | ORAL_TABLET | Freq: Every evening | ORAL | 3 refills | Status: DC | PRN

## 2016-12-28 MED ORDER — MECLIZINE HCL 25 MG PO TABS: ORAL_TABLET | ORAL | 0 refills | Status: DC

## 2016-12-28 NOTE — Progress Notes (Signed)
Pre visit review using our clinic review tool, if applicable. No additional management support is needed unless otherwise documented below in the visit note. 

## 2016-12-28 NOTE — Assessment & Plan Note (Signed)
Pt's PE WNL.  UTD on colonoscopy- done yesterday.  UTD on Tdap.  Flu shot given.  Check labs.  Anticipatory guidance provided.

## 2016-12-28 NOTE — Progress Notes (Signed)
   Subjective:    Patient ID: Martin Malone, male    DOB: 02/18/1956, 61 y.o.   MRN: [id number]  HPI CPE- UTD on Tdap, will get flu today.   Review of Systems Patient reports no vision/hearing changes, anorexia, fever ,adenopathy, persistant/recurrent hoarseness, swallowing issues, chest pain, palpitations, edema, persistant/recurrent cough, hemoptysis, dyspnea (rest,exertional, paroxysmal nocturnal), gastrointestinal  bleeding (melena, rectal bleeding), abdominal pain, excessive heart burn, GU symptoms (dysuria, hematuria, voiding/incontinence issues) syncope, focal weakness, memory loss, numbness & tingling, skin/hair/nail changes, depression, anxiety, abnormal bruising/bleeding, musculoskeletal symptoms/signs.     Objective:   Physical Exam General Appearance:    Alert, cooperative, no distress, appears stated age  Head:    Normocephalic, without obvious abnormality, atraumatic  Eyes:    PERRL, conjunctiva/corneas clear, EOM's intact, fundi    benign, both eyes       Ears:    Normal TM's and external ear canals, both ears  Nose:   Nares normal, septum midline, mucosa normal, no drainage   or sinus tenderness  Throat:   Lips, mucosa, and tongue normal; teeth and gums normal  Neck:   Supple, symmetrical, trachea midline, no adenopathy;       thyroid:  No enlargement/tenderness/nodules  Back:     Symmetric, no curvature, ROM normal, no CVA tenderness  Lungs:     Clear to auscultation bilaterally, respirations unlabored  Chest wall:    No tenderness or deformity  Heart:    Regular rate and rhythm, S1 and S2 normal, no murmur, rub   or gallop  Abdomen:     Soft, non-tender, bowel sounds active all four quadrants,    no masses, no organomegaly  Genitalia:    Normal male without lesion, masses,discharge or tenderness  Rectal:    Deferred due to colonoscopy yesterday  Extremities:   Extremities normal, atraumatic, no cyanosis or edema  Pulses:   2+ and symmetric all extremities  Skin:    Skin color, texture, turgor normal, no rashes or lesions  Lymph nodes:   Cervical, supraclavicular, and axillary nodes normal  Neurologic:   CNII-XII intact. Normal strength, sensation and reflexes      throughout          Assessment & Plan:

## 2016-12-28 NOTE — Assessment & Plan Note (Signed)
Chronic problem.  Tolerating statin w/o difficulty.  Check labs.  Adjust meds prn  

## 2016-12-28 NOTE — Patient Instructions (Signed)
Follow up in 6 months to recheck cholesterol We'll notify you of your lab results and make any changes if needed Continue to work on healthy diet and regular exercise- you can do it! Call with any questions or concerns Stay safe this weekend

## 2016-12-28 NOTE — Assessment & Plan Note (Signed)
Pt continues to have intermittent recurrences of vertigo.  Refill on Meclizine provided.

## 2017-01-13 ENCOUNTER — Other Ambulatory Visit: Payer: Self-pay | Admitting: Family Medicine

## 2017-01-17 DIAGNOSIS — D485 Neoplasm of uncertain behavior of skin: Secondary | ICD-10-CM | POA: Diagnosis not present

## 2017-01-17 DIAGNOSIS — L821 Other seborrheic keratosis: Secondary | ICD-10-CM | POA: Diagnosis not present

## 2017-01-17 DIAGNOSIS — Z85828 Personal history of other malignant neoplasm of skin: Secondary | ICD-10-CM | POA: Diagnosis not present

## 2017-01-17 DIAGNOSIS — C44329 Squamous cell carcinoma of skin of other parts of face: Secondary | ICD-10-CM | POA: Diagnosis not present

## 2017-01-17 DIAGNOSIS — L570 Actinic keratosis: Secondary | ICD-10-CM | POA: Diagnosis not present

## 2017-02-15 ENCOUNTER — Encounter: Payer: Self-pay | Admitting: General Practice

## 2017-02-28 DIAGNOSIS — D0439 Carcinoma in situ of skin of other parts of face: Secondary | ICD-10-CM | POA: Diagnosis not present

## 2017-02-28 DIAGNOSIS — C44329 Squamous cell carcinoma of skin of other parts of face: Secondary | ICD-10-CM | POA: Diagnosis not present

## 2017-02-28 DIAGNOSIS — L570 Actinic keratosis: Secondary | ICD-10-CM | POA: Diagnosis not present

## 2017-06-27 ENCOUNTER — Ambulatory Visit: Payer: BLUE CROSS/BLUE SHIELD | Admitting: Family Medicine

## 2017-06-27 ENCOUNTER — Other Ambulatory Visit: Payer: Self-pay

## 2017-06-27 ENCOUNTER — Encounter: Payer: Self-pay | Admitting: Family Medicine

## 2017-06-27 VITALS — BP 120/81 | HR 70 | Temp 98.1°F | Resp 16 | Ht 70.0 in | Wt 197.1 lb

## 2017-06-27 DIAGNOSIS — G8929 Other chronic pain: Secondary | ICD-10-CM | POA: Diagnosis not present

## 2017-06-27 DIAGNOSIS — M25562 Pain in left knee: Secondary | ICD-10-CM

## 2017-06-27 DIAGNOSIS — E785 Hyperlipidemia, unspecified: Secondary | ICD-10-CM | POA: Diagnosis not present

## 2017-06-27 LAB — CBC WITH DIFFERENTIAL/PLATELET
Basophils Absolute: 0.1 10*3/uL (ref 0.0–0.1)
Basophils Relative: 0.9 % (ref 0.0–3.0)
Eosinophils Absolute: 0.3 10*3/uL (ref 0.0–0.7)
Eosinophils Relative: 4 % (ref 0.0–5.0)
HCT: 40.6 % (ref 39.0–52.0)
Hemoglobin: 13.7 g/dL (ref 13.0–17.0)
Lymphocytes Relative: 28.9 % (ref 12.0–46.0)
Lymphs Abs: 1.9 10*3/uL (ref 0.7–4.0)
MCHC: 33.7 g/dL (ref 30.0–36.0)
MCV: 97 fl (ref 78.0–100.0)
Monocytes Absolute: 0.5 10*3/uL (ref 0.1–1.0)
Monocytes Relative: 8.3 % (ref 3.0–12.0)
Neutro Abs: 3.8 10*3/uL (ref 1.4–7.7)
Neutrophils Relative %: 57.9 % (ref 43.0–77.0)
Platelets: 231 10*3/uL (ref 150.0–400.0)
RBC: 4.19 Mil/uL — ABNORMAL LOW (ref 4.22–5.81)
RDW: 14.1 % (ref 11.5–15.5)
WBC: 6.6 10*3/uL (ref 4.0–10.5)

## 2017-06-27 LAB — LIPID PANEL
Cholesterol: 207 mg/dL — ABNORMAL HIGH (ref 0–200)
HDL: 82.1 mg/dL (ref >39.00)
LDL Cholesterol: 108 mg/dL — ABNORMAL HIGH (ref 0–99)
NonHDL: 125.16
Total CHOL/HDL Ratio: 3
Triglycerides: 86 mg/dL (ref 0.0–149.0)
VLDL: 17.2 mg/dL (ref 0.0–40.0)

## 2017-06-27 LAB — HEPATIC FUNCTION PANEL
ALT: 26 U/L (ref 0–53)
AST: 23 U/L (ref 0–37)
Albumin: 4.3 g/dL (ref 3.5–5.2)
Alkaline Phosphatase: 46 U/L (ref 39–117)
Bilirubin, Direct: 0.1 mg/dL (ref 0.0–0.3)
Total Bilirubin: 0.7 mg/dL (ref 0.2–1.2)
Total Protein: 7 g/dL (ref 6.0–8.3)

## 2017-06-27 LAB — BASIC METABOLIC PANEL
BUN: 19 mg/dL (ref 6–23)
CO2: 28 mEq/L (ref 19–32)
Calcium: 9.6 mg/dL (ref 8.4–10.5)
Chloride: 100 mEq/L (ref 96–112)
Creatinine, Ser: 0.98 mg/dL (ref 0.40–1.50)
GFR: 82.2 mL/min (ref >60.00)
Glucose, Bld: 90 mg/dL (ref 70–99)
Potassium: 4.5 mEq/L (ref 3.5–5.1)
Sodium: 137 mEq/L (ref 135–145)

## 2017-06-27 LAB — TSH: TSH: 2.49 u[IU]/mL (ref 0.35–4.50)

## 2017-06-27 NOTE — Assessment & Plan Note (Signed)
Chronic problem.  Tolerating statin w/o difficulty.  Applauded weight loss efforts.  Check labs.  Adjust meds prn

## 2017-06-27 NOTE — Progress Notes (Signed)
   Subjective:    Patient ID: Martin Malone, male    DOB: Nov 17, 1955, 62 y.o.   MRN: [id number]  HPI Hyperlipidemia- chronic problem, on Lipitor 10mg  nightly.  Pt has lost 4 lbs since last visit.  Denies CP, SOB, HAs, visual changes, abd pain, N/V.  Knee pain- L knee, pain is worsening over the last few weeks.  'it feels like needles'.  Improves w/ ice but will swell.  Hx of knee surgery in 2006- Dr Lorre Nick.     Review of Systems For ROS see HPI     Objective:   Physical Exam  Constitutional: He is oriented to person, place, and time. He appears well-developed and well-nourished. No distress.  HENT:  Head: Normocephalic and atraumatic.  Eyes: Conjunctivae and EOM are normal. Pupils are equal, round, and reactive to light.  Neck: Normal range of motion. Neck supple. No thyromegaly present.  Cardiovascular: Normal rate, regular rhythm, normal heart sounds and intact distal pulses.  No murmur heard. Pulmonary/Chest: Effort normal and breath sounds normal. No respiratory distress.  Abdominal: Soft. Bowel sounds are normal. He exhibits no distension.  Musculoskeletal: He exhibits edema (L knee swelling superior to medial joint line w/ TTP).  Lymphadenopathy:    He has no cervical adenopathy.  Neurological: He is alert and oriented to person, place, and time. No cranial nerve deficit.  Skin: Skin is warm and dry.  Psychiatric: He has a normal mood and affect. His behavior is normal.  Vitals reviewed.         Assessment & Plan:  L knee pain- pt had surgery years ago w/ Dr Ronnie Derby but has recently had worsening knee pain w/ swelling.  Refer back to ortho for evaluation and tx.  Pt expressed understanding and is in agreement w/ plan.

## 2017-06-27 NOTE — Patient Instructions (Signed)
Schedule your complete physical in 6 months We'll notify you of your lab results and make any changes if needed Continue to work on healthy diet and regular exercise- you're doing great! Call with any questions or concerns Happy Spring!!! 

## 2017-06-28 ENCOUNTER — Encounter: Payer: Self-pay | Admitting: General Practice

## 2017-07-05 DIAGNOSIS — M25762 Osteophyte, left knee: Secondary | ICD-10-CM | POA: Diagnosis not present

## 2017-07-05 DIAGNOSIS — M1712 Unilateral primary osteoarthritis, left knee: Secondary | ICD-10-CM | POA: Diagnosis not present

## 2017-07-05 DIAGNOSIS — G8929 Other chronic pain: Secondary | ICD-10-CM | POA: Diagnosis not present

## 2017-07-05 DIAGNOSIS — Z9889 Other specified postprocedural states: Secondary | ICD-10-CM | POA: Diagnosis not present

## 2017-07-06 ENCOUNTER — Telehealth (INDEPENDENT_AMBULATORY_CARE_PROVIDER_SITE_OTHER): Payer: BLUE CROSS/BLUE SHIELD | Admitting: Family Medicine

## 2017-07-06 DIAGNOSIS — Z01818 Encounter for other preprocedural examination: Secondary | ICD-10-CM | POA: Diagnosis not present

## 2017-07-06 NOTE — Telephone Encounter (Signed)
Pt dropped off Surgical Clearance paperwork and request for lab orders, from Dr. Ronnie Derby. Pt requested to be called if appointment is needed.  Placed on front bin with charge sheet.

## 2017-07-09 NOTE — Telephone Encounter (Signed)
Per Dr. Birdie Riddle, pt needs a nurse visit for EKG. Will call to schedule pt.

## 2017-07-09 NOTE — Telephone Encounter (Signed)
Called pt and scheduled nurse visit for 07/11/17 @ 9:15.

## 2017-07-09 NOTE — Telephone Encounter (Signed)
I do not have forms for him at this time

## 2017-07-09 NOTE — Telephone Encounter (Signed)
Orders placed.

## 2017-07-09 NOTE — Telephone Encounter (Signed)
Please advise, did you receive this.

## 2017-07-11 ENCOUNTER — Ambulatory Visit (INDEPENDENT_AMBULATORY_CARE_PROVIDER_SITE_OTHER): Payer: BLUE CROSS/BLUE SHIELD | Admitting: Family Medicine

## 2017-07-11 DIAGNOSIS — Z01818 Encounter for other preprocedural examination: Secondary | ICD-10-CM | POA: Diagnosis not present

## 2017-07-11 NOTE — Progress Notes (Signed)
Patient presents today for EKG for surgical clearance.  Above order was mine and pt's EKG reviewed.  Pt ok for surgery.

## 2017-07-14 ENCOUNTER — Other Ambulatory Visit: Payer: Self-pay | Admitting: Family Medicine

## 2017-07-16 NOTE — Telephone Encounter (Signed)
Last OV 07/11/17 Zolpidem last filled 12/28/16 #30 with 3

## 2017-08-27 DIAGNOSIS — M1712 Unilateral primary osteoarthritis, left knee: Secondary | ICD-10-CM | POA: Diagnosis not present

## 2017-09-06 DIAGNOSIS — M25462 Effusion, left knee: Secondary | ICD-10-CM | POA: Diagnosis not present

## 2017-09-06 DIAGNOSIS — Z96652 Presence of left artificial knee joint: Secondary | ICD-10-CM | POA: Diagnosis not present

## 2017-10-18 ENCOUNTER — Other Ambulatory Visit: Payer: Self-pay | Admitting: Physician Assistant

## 2017-11-06 DIAGNOSIS — S39012A Strain of muscle, fascia and tendon of lower back, initial encounter: Secondary | ICD-10-CM | POA: Diagnosis not present

## 2017-11-14 ENCOUNTER — Telehealth: Payer: Self-pay | Admitting: Family Medicine

## 2017-11-14 ENCOUNTER — Other Ambulatory Visit: Payer: Self-pay | Admitting: Physician Assistant

## 2017-11-14 NOTE — Telephone Encounter (Signed)
Copied from [place] [phone] [id number]. Topic: Quick Communication - See Telephone Encounter >> Nov 14, 2017  3:58 PM Mylinda Latina, NT wrote: CRM for notification. See Telephone encounter for: 11/14/17. Patient called and states he needs a refill of his atorvastatin (LIPITOR) 10 MG tablet  [id number] - [place], [place] - [place] [phone] (Phone) [phone] (Fax)

## 2017-11-15 ENCOUNTER — Other Ambulatory Visit: Payer: Self-pay | Admitting: *Deleted

## 2017-11-15 MED ORDER — ATORVASTATIN CALCIUM 10 MG PO TABS: 10.0000 mg | ORAL_TABLET | Freq: Every day | ORAL | 1 refills | Status: DC

## 2017-11-15 NOTE — Telephone Encounter (Signed)
Rx refilled per protocol- LOV/lab- 06/27/17 with f/u scheduled 01/21/18

## 2017-11-22 DIAGNOSIS — Z96652 Presence of left artificial knee joint: Secondary | ICD-10-CM | POA: Diagnosis not present

## 2017-11-22 DIAGNOSIS — Z471 Aftercare following joint replacement surgery: Secondary | ICD-10-CM | POA: Diagnosis not present

## 2017-12-04 DIAGNOSIS — H5203 Hypermetropia, bilateral: Secondary | ICD-10-CM | POA: Diagnosis not present

## 2017-12-04 DIAGNOSIS — H524 Presbyopia: Secondary | ICD-10-CM | POA: Diagnosis not present

## 2017-12-04 DIAGNOSIS — H52223 Regular astigmatism, bilateral: Secondary | ICD-10-CM | POA: Diagnosis not present

## 2017-12-09 ENCOUNTER — Other Ambulatory Visit: Payer: Self-pay | Admitting: Family Medicine

## 2017-12-10 NOTE — Telephone Encounter (Signed)
Last OV 07/11/17, Next OV 01/21/18  Last filled 07/16/17, # 30 with 1 refill

## 2017-12-11 DIAGNOSIS — D485 Neoplasm of uncertain behavior of skin: Secondary | ICD-10-CM | POA: Diagnosis not present

## 2017-12-11 DIAGNOSIS — L570 Actinic keratosis: Secondary | ICD-10-CM | POA: Diagnosis not present

## 2017-12-11 DIAGNOSIS — L430 Hypertrophic lichen planus: Secondary | ICD-10-CM | POA: Diagnosis not present

## 2018-01-21 ENCOUNTER — Other Ambulatory Visit: Payer: Self-pay

## 2018-01-21 ENCOUNTER — Encounter: Payer: Self-pay | Admitting: Family Medicine

## 2018-01-21 ENCOUNTER — Ambulatory Visit (INDEPENDENT_AMBULATORY_CARE_PROVIDER_SITE_OTHER): Payer: BLUE CROSS/BLUE SHIELD | Admitting: Family Medicine

## 2018-01-21 VITALS — BP 120/81 | HR 78 | Temp 98.1°F | Resp 16 | Ht 70.0 in | Wt 188.5 lb

## 2018-01-21 DIAGNOSIS — Z23 Encounter for immunization: Secondary | ICD-10-CM

## 2018-01-21 DIAGNOSIS — Z0000 Encounter for general adult medical examination without abnormal findings: Secondary | ICD-10-CM | POA: Diagnosis not present

## 2018-01-21 DIAGNOSIS — E785 Hyperlipidemia, unspecified: Secondary | ICD-10-CM | POA: Diagnosis not present

## 2018-01-21 DIAGNOSIS — Z125 Encounter for screening for malignant neoplasm of prostate: Secondary | ICD-10-CM

## 2018-01-21 LAB — CBC WITH DIFFERENTIAL/PLATELET
Basophils Absolute: 0.1 10*3/uL (ref 0.0–0.1)
Basophils Relative: 0.7 % (ref 0.0–3.0)
Eosinophils Absolute: 0.2 10*3/uL (ref 0.0–0.7)
Eosinophils Relative: 2.5 % (ref 0.0–5.0)
HCT: 39.9 % (ref 39.0–52.0)
Hemoglobin: 13.5 g/dL (ref 13.0–17.0)
Lymphocytes Relative: 25.2 % (ref 12.0–46.0)
Lymphs Abs: 1.9 10*3/uL (ref 0.7–4.0)
MCHC: 33.9 g/dL (ref 30.0–36.0)
MCV: 94.7 fl (ref 78.0–100.0)
Monocytes Absolute: 0.6 10*3/uL (ref 0.1–1.0)
Monocytes Relative: 7.5 % (ref 3.0–12.0)
Neutro Abs: 4.9 10*3/uL (ref 1.4–7.7)
Neutrophils Relative %: 64.1 % (ref 43.0–77.0)
Platelets: 251 10*3/uL (ref 150.0–400.0)
RBC: 4.21 Mil/uL — ABNORMAL LOW (ref 4.22–5.81)
RDW: 14.8 % (ref 11.5–15.5)
WBC: 7.7 10*3/uL (ref 4.0–10.5)

## 2018-01-21 LAB — BASIC METABOLIC PANEL
BUN: 16 mg/dL (ref 6–23)
CO2: 27 mEq/L (ref 19–32)
Calcium: 9.5 mg/dL (ref 8.4–10.5)
Chloride: 103 mEq/L (ref 96–112)
Creatinine, Ser: 1.1 mg/dL (ref 0.40–1.50)
GFR: 71.81 mL/min (ref >60.00)
Glucose, Bld: 90 mg/dL (ref 70–99)
Potassium: 5.2 mEq/L — ABNORMAL HIGH (ref 3.5–5.1)
Sodium: 137 mEq/L (ref 135–145)

## 2018-01-21 LAB — HEPATIC FUNCTION PANEL
ALT: 18 U/L (ref 0–53)
AST: 22 U/L (ref 0–37)
Albumin: 4.5 g/dL (ref 3.5–5.2)
Alkaline Phosphatase: 47 U/L (ref 39–117)
Bilirubin, Direct: 0.1 mg/dL (ref 0.0–0.3)
Total Bilirubin: 0.4 mg/dL (ref 0.2–1.2)
Total Protein: 7 g/dL (ref 6.0–8.3)

## 2018-01-21 LAB — TSH: TSH: 1.69 u[IU]/mL (ref 0.35–4.50)

## 2018-01-21 LAB — LIPID PANEL
Cholesterol: 164 mg/dL (ref 0–200)
HDL: 60.9 mg/dL (ref >39.00)
LDL Cholesterol: 90 mg/dL (ref 0–99)
NonHDL: 103.12
Total CHOL/HDL Ratio: 3
Triglycerides: 64 mg/dL (ref 0.0–149.0)
VLDL: 12.8 mg/dL (ref 0.0–40.0)

## 2018-01-21 LAB — PSA: PSA: 2.36 ng/mL (ref 0.10–4.00)

## 2018-01-21 MED ORDER — ZOLPIDEM TARTRATE 10 MG PO TABS: ORAL_TABLET | ORAL | 3 refills | Status: DC

## 2018-01-21 NOTE — Assessment & Plan Note (Signed)
Chronic problem.  Tolerating statin w/o difficulty.  Check labs.  Adjust meds prn  

## 2018-01-21 NOTE — Progress Notes (Signed)
   Subjective:    Patient ID: Martin Malone, male    DOB: 12/20/1955, 62 y.o.   MRN: [id number]  HPI CPE- UTD on colonoscopy, Tdap.  Flu today.   Review of Systems Patient reports no vision/hearing changes, anorexia, fever ,adenopathy, persistant/recurrent hoarseness, swallowing issues, chest pain, palpitations, edema, persistant/recurrent cough, hemoptysis, dyspnea (rest,exertional, paroxysmal nocturnal), gastrointestinal  bleeding (melena, rectal bleeding), abdominal pain, excessive heart burn, GU symptoms (dysuria, hematuria, voiding/incontinence issues) syncope, focal weakness, memory loss, numbness & tingling, skin/hair/nail changes, depression, anxiety, abnormal bruising/bleeding, musculoskeletal symptoms/signs.     Objective:   Physical Exam BP 120/81   Pulse 78   Temp 98.1 F (36.7 C) (Oral)   Resp 16   Ht 5\' 10"  (1.778 m)   Wt 188 lb 8 oz (85.5 kg)   SpO2 97%   BMI 27.05 kg/m   General Appearance:    Alert, cooperative, no distress, appears stated age  Head:    Normocephalic, without obvious abnormality, atraumatic  Eyes:    PERRL, conjunctiva/corneas clear, EOM's intact, fundi    benign, both eyes       Ears:    Normal TM's and external ear canals, both ears  Nose:   Nares normal, septum midline, mucosa normal, no drainage   or sinus tenderness  Throat:   Lips, mucosa, and tongue normal; teeth and gums normal  Neck:   Supple, symmetrical, trachea midline, no adenopathy;       thyroid:  No enlargement/tenderness/nodules  Back:     Symmetric, no curvature, ROM normal, no CVA tenderness  Lungs:     Clear to auscultation bilaterally, respirations unlabored  Chest wall:    No tenderness or deformity  Heart:    Regular rate and rhythm, S1 and S2 normal, no murmur, rub   or gallop  Abdomen:     Soft, non-tender, bowel sounds active all four quadrants,    no masses, no organomegaly  Genitalia:    Normal male without lesion, discharge or tenderness  Rectal:    Normal  tone, normal prostate, no masses or tenderness  Extremities:   Extremities normal, atraumatic, no cyanosis or edema  Pulses:   2+ and symmetric all extremities  Skin:   Skin color, texture, turgor normal, no rashes or lesions  Lymph nodes:   Cervical, supraclavicular, and axillary nodes normal  Neurologic:   CNII-XII intact. Normal strength, sensation and reflexes      throughout          Assessment & Plan:

## 2018-01-21 NOTE — Patient Instructions (Signed)
Follow up in 6 months to recheck cholesterol We'll notify you of your lab results and make any changes if needed Keep up the good work on healthy diet and regular exercise- you look great! Call with any questions or concerns Happy Fall!!

## 2018-01-21 NOTE — Assessment & Plan Note (Signed)
Pt's PE WNL.  UTD on colonoscopy, Tdap.  Flu given today.  Check labs.  Anticipatory guidance provided.

## 2018-01-21 NOTE — Addendum Note (Signed)
Addended by: Midge Minium on: 01/21/2018 09:30 AM   Modules accepted: Orders

## 2018-01-22 ENCOUNTER — Encounter: Payer: Self-pay | Admitting: General Practice

## 2018-02-07 DIAGNOSIS — L570 Actinic keratosis: Secondary | ICD-10-CM | POA: Diagnosis not present

## 2018-02-07 DIAGNOSIS — Z23 Encounter for immunization: Secondary | ICD-10-CM | POA: Diagnosis not present

## 2018-02-07 DIAGNOSIS — Z85828 Personal history of other malignant neoplasm of skin: Secondary | ICD-10-CM | POA: Diagnosis not present

## 2018-02-07 DIAGNOSIS — L821 Other seborrheic keratosis: Secondary | ICD-10-CM | POA: Diagnosis not present

## 2018-04-03 DIAGNOSIS — Z23 Encounter for immunization: Secondary | ICD-10-CM | POA: Diagnosis not present

## 2018-04-03 DIAGNOSIS — B079 Viral wart, unspecified: Secondary | ICD-10-CM | POA: Diagnosis not present

## 2018-04-03 DIAGNOSIS — L570 Actinic keratosis: Secondary | ICD-10-CM | POA: Diagnosis not present

## 2018-05-08 ENCOUNTER — Other Ambulatory Visit: Payer: Self-pay | Admitting: Family Medicine

## 2018-05-23 DIAGNOSIS — M79605 Pain in left leg: Secondary | ICD-10-CM | POA: Diagnosis not present

## 2018-05-23 DIAGNOSIS — Z96652 Presence of left artificial knee joint: Secondary | ICD-10-CM | POA: Diagnosis not present

## 2018-06-24 ENCOUNTER — Other Ambulatory Visit: Payer: Self-pay | Admitting: Family Medicine

## 2018-06-24 NOTE — Telephone Encounter (Signed)
Last OV 01/21/18 Zolpidem last filled 01/21/18 #15 with 3

## 2018-07-19 ENCOUNTER — Ambulatory Visit (INDEPENDENT_AMBULATORY_CARE_PROVIDER_SITE_OTHER): Payer: BLUE CROSS/BLUE SHIELD | Admitting: Family Medicine

## 2018-07-19 ENCOUNTER — Encounter: Payer: Self-pay | Admitting: Family Medicine

## 2018-07-19 ENCOUNTER — Other Ambulatory Visit: Payer: Self-pay

## 2018-07-19 VITALS — Ht 70.0 in | Wt 186.0 lb

## 2018-07-19 DIAGNOSIS — E785 Hyperlipidemia, unspecified: Secondary | ICD-10-CM | POA: Diagnosis not present

## 2018-07-19 DIAGNOSIS — E663 Overweight: Secondary | ICD-10-CM | POA: Diagnosis not present

## 2018-07-19 NOTE — Progress Notes (Signed)
I have discussed the procedure for the virtual visit with the patient who has given consent to proceed with assessment and treatment.   BETHANY DILLARD, CMA     

## 2018-07-19 NOTE — Progress Notes (Signed)
   Virtual Visit via Video   I connected with@ on 07/19/18 at  9:00 AM EDT by a video enabled telemedicine application and verified that I am speaking with the correct person using two identifiers. Location patient: Home Location provider: [facility], Office Persons participating in the virtual visit: pt and myself  I discussed the limitations of evaluation and management by telemedicine and the availability of in person appointments. The patient expressed understanding and agreed to proceed.  Subjective:   HPI:  Hyperlipidemia- chronic problem, on Lipitor 10mg  daily.  Currently walking daily 5-7 miles, weights at home.  No CP, SOB, HAs, visual changes, abd pain, N/V.  ROS: See pertinent positives and negatives per HPI.  Patient Active Problem List   Diagnosis Date Noted  . Left lateral epicondylitis 02/13/2016  . Lateral epicondylitis of right elbow 02/06/2014  . Peroneal tendinitis of right lower extremity 07/15/2013  . History of meniscal tear 07/15/2013  . BPV (benign positional vertigo) 05/28/2013  . Routine general medical examination at a health care facility 11/19/2012  . Hyperlipidemia 05/22/2012  . Family history of premature CAD 11/29/2011  . Chest pain at rest 11/29/2011    Social History   Tobacco Use  . Smoking status: Former Smoker    Last attempt to quit: 04/17/1977    Years since quitting: 41.2  . Smokeless tobacco: Never Used  Substance Use Topics  . Alcohol use: Yes    Alcohol/week: 2.0 standard drinks    Types: 2 Glasses of wine per week    Current Outpatient Medications:  .  acetaminophen (TYLENOL) 500 MG tablet, Take 500 mg by mouth as needed., Disp: , Rfl:  .  aspirin 81 MG tablet, Take 81 mg by mouth daily., Disp: , Rfl:  .  atorvastatin (LIPITOR) 10 MG tablet, TAKE 1 TABLET BY MOUTH EVERYDAY AT BEDTIME, Disp: 90 tablet, Rfl: 1 .  cetirizine (ZYRTEC) 10 MG tablet, Take 10 mg by mouth daily., Disp: , Rfl:  .  glucosamine-chondroitin  500-400 MG tablet, Take 1 tablet by mouth daily., Disp: , Rfl:  .  ibuprofen (ADVIL,MOTRIN) 200 MG tablet, Take 600 mg by mouth daily as needed. , Disp: , Rfl:  .  KRILL OIL PO, Take 2 tablets by mouth daily., Disp: , Rfl:  .  Multiple Vitamin (MULTIVITAMIN) tablet, Take 1 tablet by mouth daily., Disp: , Rfl:  .  zolpidem (AMBIEN) 10 MG tablet, TAKE 1 TABLET BY MOUTH AT BEDTIME AS NEEDED FOR SLEEP**MAX PER INS**, Disp: 15 tablet, Rfl: 3  No Known Allergies  Objective:   Ht 5\' 10"  (1.778 m)   Wt 186 lb (84.4 kg)   BMI 26.69 kg/m  AAOx3, NAD NCAT, EOMI No obvious CN deficits Coloring WNL He shaved his beard! Pt is able to speak clearly, coherently without shortness of breath or increased work of breathing.  Thought process is linear.  Mood is appropriate.   Assessment and Plan:    Hyperlipidemia- chronic problem, tolerating statin w/o difficulty.  Applauded his exercise efforts.  Check labs.  Adjust meds prn   Overweight- BMI is 26.69  Pt is exercising daily.  Applauded his efforts.  Will follow.   Annye Asa, MD 07/19/2018

## 2018-07-22 ENCOUNTER — Other Ambulatory Visit: Payer: BLUE CROSS/BLUE SHIELD

## 2018-07-23 ENCOUNTER — Ambulatory Visit: Payer: BLUE CROSS/BLUE SHIELD | Admitting: Family Medicine

## 2018-07-23 ENCOUNTER — Other Ambulatory Visit (INDEPENDENT_AMBULATORY_CARE_PROVIDER_SITE_OTHER): Payer: BLUE CROSS/BLUE SHIELD

## 2018-07-23 DIAGNOSIS — E785 Hyperlipidemia, unspecified: Secondary | ICD-10-CM | POA: Diagnosis not present

## 2018-07-23 LAB — HEPATIC FUNCTION PANEL
ALT: 19 U/L (ref 0–53)
AST: 24 U/L (ref 0–37)
Albumin: 4.4 g/dL (ref 3.5–5.2)
Alkaline Phosphatase: 40 U/L (ref 39–117)
Bilirubin, Direct: 0.1 mg/dL (ref 0.0–0.3)
Total Bilirubin: 0.7 mg/dL (ref 0.2–1.2)
Total Protein: 6.6 g/dL (ref 6.0–8.3)

## 2018-07-23 LAB — LIPID PANEL
Cholesterol: 171 mg/dL (ref 0–200)
HDL: 64.5 mg/dL (ref >39.00)
LDL Cholesterol: 94 mg/dL (ref 0–99)
NonHDL: 106.78
Total CHOL/HDL Ratio: 3
Triglycerides: 64 mg/dL (ref 0.0–149.0)
VLDL: 12.8 mg/dL (ref 0.0–40.0)

## 2018-07-23 LAB — BASIC METABOLIC PANEL
BUN: 14 mg/dL (ref 6–23)
CO2: 27 mEq/L (ref 19–32)
Calcium: 9.3 mg/dL (ref 8.4–10.5)
Chloride: 104 mEq/L (ref 96–112)
Creatinine, Ser: 1.04 mg/dL (ref 0.40–1.50)
GFR: 71.96 mL/min (ref >60.00)
Glucose, Bld: 91 mg/dL (ref 70–99)
Potassium: 4.4 mEq/L (ref 3.5–5.1)
Sodium: 139 mEq/L (ref 135–145)

## 2018-08-27 DIAGNOSIS — Z96652 Presence of left artificial knee joint: Secondary | ICD-10-CM | POA: Diagnosis not present

## 2018-10-18 ENCOUNTER — Ambulatory Visit: Payer: Self-pay | Admitting: *Deleted

## 2018-10-18 NOTE — Telephone Encounter (Signed)
Pt states he thinks he bruised his heel about 2 weeks ago and it is still bothering him. Pt wants to know if there is anything he can do for it? Pt states he is looking for some guidance since it has not gotten better.   Pt stated that his right heel feels like a bruise. He has not done anything to injury himself. He walks every day but probably is walking more now. He wears a sneaker that has memory foam which really helps him. When he tries walking without shoes on, his heel is very sore.  He has been taking ibuprofen and icing his heel which seems to be working for him. He will continue to do that and will call back and schedule an appointment if does not get any better by Monday. Routing to [facility] at [facility].  Reason for Disposition . [1] MILD pain (e.g., does not interfere with normal activities) AND [2] present > 7 days  Answer Assessment - Initial Assessment Questions 1. ONSET: "When did the pain start?"      About 2 weeks ago 2. LOCATION: "Where is the pain located?"      Bruised heel right 3. PAIN: "How bad is the pain?"    (Scale 1-10; or mild, moderate, severe)   -  MILD (1-3): doesn't interfere with normal activities    -  MODERATE (4-7): interferes with normal activities (e.g., work or school) or awakens from sleep, limping    -  SEVERE (8-10): excruciating pain, unable to do any normal activities, unable to walk     When walking with shoes on it is a 4 or 6 if bare footed 4. WORK OR EXERCISE: "Has there been any recent work or exercise that involved this part of the body?"      Walking and sometimes more now 5. CAUSE: "What do you think is causing the foot pain?"     Not sure maybe a bruised 6. OTHER SYMPTOMS: "Do you have any other symptoms?" (e.g., leg pain, rash, fever, numbness)     no 7. PREGNANCY: "Is there any chance you are pregnant?" "When was your last menstrual period?"     no  Protocols used: FOOT PAIN-A-AH

## 2018-10-21 NOTE — Telephone Encounter (Signed)
Please advise? It says pt will call back for appt if no better

## 2018-10-21 NOTE — Telephone Encounter (Signed)
Agree w/ ice, ibuprofen.  Wearing supportive, cushioned rather than walking barefoot or in flip flops will also help

## 2018-10-21 NOTE — Telephone Encounter (Signed)
Patient notified of PCP recommendations and is agreement and expresses an understanding. Will call back in a couple of days to schedule a VV if not better.   [place] for [facility] to Discuss results / PCP recommendations / Schedule patient.

## 2018-10-28 ENCOUNTER — Ambulatory Visit (INDEPENDENT_AMBULATORY_CARE_PROVIDER_SITE_OTHER): Payer: BC Managed Care – PPO | Admitting: Physician Assistant

## 2018-10-28 ENCOUNTER — Encounter: Payer: Self-pay | Admitting: Physician Assistant

## 2018-10-28 ENCOUNTER — Other Ambulatory Visit: Payer: Self-pay

## 2018-10-28 VITALS — HR 62 | Temp 96.9°F | Wt 185.0 lb

## 2018-10-28 DIAGNOSIS — M722 Plantar fascial fibromatosis: Secondary | ICD-10-CM | POA: Diagnosis not present

## 2018-10-28 MED ORDER — MELOXICAM 15 MG PO TABS: 15.0000 mg | ORAL_TABLET | Freq: Every day | ORAL | 0 refills | Status: DC

## 2018-10-28 NOTE — Progress Notes (Signed)
I have discussed the procedure for the virtual visit with the patient who has given consent to proceed with assessment and treatment.   [place] S Laurine Kuyper, CMA     

## 2018-10-28 NOTE — Progress Notes (Signed)
Virtual Visit via Video   I connected with patient on 10/28/18 at 11:30 AM EDT by a video enabled telemedicine application and verified that I am speaking with the correct person using two identifiers.  Location patient: Home Location provider: [facility], Office Persons participating in the virtual visit: Patient, Provider, Isabella (Patina Moore)  I discussed the limitations of evaluation and management by telemedicine and the availability of in person appointments. The patient expressed understanding and agreed to proceed.  Subjective:   HPI:   Patient presents via [employer]Me today c/o 3-4 weeks of pain in the R heel described as aching pain that is constant there. . Notes prior history of this that always resolves itself within a week. Notes this time has been present without improvement. Since Sunday this has worsened. Denies noted swelling, redness of the area. Pain is in the R mid heel. Notes significant pain with fist few steps out of bed in the morning or after sitting a while. Notes pain initially improves with ambulation but after a while the aching worsens. Notes better with footwear than trying to ambulate barefoot. Is wearing supportive footwear. Has taken some Ibuprofen with mild relief in symptoms. Denies symptoms or L foot. Denies known history of heel spurring.   ROS:   See pertinent positives and negatives per HPI.  Patient Active Problem List   Diagnosis Date Noted  . Overweight (BMI 25.0-29.9) 07/19/2018  . Left lateral epicondylitis 02/13/2016  . Lateral epicondylitis of right elbow 02/06/2014  . Peroneal tendinitis of right lower extremity 07/15/2013  . History of meniscal tear 07/15/2013  . BPV (benign positional vertigo) 05/28/2013  . Routine general medical examination at a health care facility 11/19/2012  . Hyperlipidemia 05/22/2012  . Family history of premature CAD 11/29/2011  . Chest pain at rest 11/29/2011    Social History   Tobacco Use  .  Smoking status: Former Smoker    Quit date: 04/17/1977    Years since quitting: 41.5  . Smokeless tobacco: Never Used  Substance Use Topics  . Alcohol use: Yes    Alcohol/week: 2.0 standard drinks    Types: 2 Glasses of wine per week    Current Outpatient Medications:  .  acetaminophen (TYLENOL) 500 MG tablet, Take 500 mg by mouth as needed., Disp: , Rfl:  .  aspirin 81 MG tablet, Take 81 mg by mouth daily., Disp: , Rfl:  .  atorvastatin (LIPITOR) 10 MG tablet, TAKE 1 TABLET BY MOUTH EVERYDAY AT BEDTIME, Disp: 90 tablet, Rfl: 1 .  cetirizine (ZYRTEC) 10 MG tablet, Take 10 mg by mouth daily., Disp: , Rfl:  .  glucosamine-chondroitin 500-400 MG tablet, Take 1 tablet by mouth daily., Disp: , Rfl:  .  ibuprofen (ADVIL,MOTRIN) 200 MG tablet, Take 600 mg by mouth daily as needed. , Disp: , Rfl:  .  KRILL OIL PO, Take 2 tablets by mouth daily., Disp: , Rfl:  .  Multiple Vitamin (MULTIVITAMIN) tablet, Take 1 tablet by mouth daily., Disp: , Rfl:  .  zolpidem (AMBIEN) 10 MG tablet, TAKE 1 TABLET BY MOUTH AT BEDTIME AS NEEDED FOR SLEEP**MAX PER INS**, Disp: 15 tablet, Rfl: 3  No Known Allergies  Objective:   Pulse 62   Temp (!) 96.9 F (36.1 C) (Oral)   Wt 185 lb (83.9 kg)   BMI 26.54 kg/m   Patient is well-developed, well-nourished in no acute distress.  Resting comfortably at home.  Head is normocephalic, atraumatic.  No labored breathing.  Speech is  clear and coherent with logical contest.  Patient is alert and oriented at baseline.   Assessment and Plan:   1. Plantar fasciitis of right foot Start Meloxicam once daily. Tylenol for breakthrough pain. Continue use of supportive footwear. Start cold can rolls. Therapy exercises reviewed and handout given. Will send to Podiatry if not improving in the next 1-2 weeks.  - meloxicam (MOBIC) 15 MG tablet; Take 1 tablet (15 mg total) by mouth daily.  Dispense: 30 tablet; Refill: 0    Leeanne Rio, [place] 10/28/2018

## 2018-10-28 NOTE — Patient Instructions (Signed)
Instructions sent to MyChart.   Please take the Meloxicam once daily with food.  Tylenol for breakthrough pain. Continue wearing your supportive walking shoes.  Avoid barefoot walking for now.  Start the cold can exercises discussed each morning and a few times in the day when able. See stretching exercises below. If not improving over the next 1-2 weeks, would recommend assessment with Podiatry and potential x-ray giving how long this issue has been present for you.    Plantar Fasciitis Rehab Ask your health care provider which exercises are safe for you. Do exercises exactly as told by your health care provider and adjust them as directed. It is normal to feel mild stretching, pulling, tightness, or discomfort as you do these exercises. Stop right away if you feel sudden pain or your pain gets worse. Do not begin these exercises until told by your health care provider. Stretching and range-of-motion exercises These exercises warm up your muscles and joints and improve the movement and flexibility of your foot. These exercises also help to relieve pain. Plantar fascia stretch  1. Sit with your left / right leg crossed over your opposite knee. 2. Hold your heel with one hand with that thumb near your arch. With your other hand, hold your toes and gently pull them back toward the top of your foot. You should feel a stretch on the bottom of your toes or your foot (plantar fascia) or both. 3. Hold this stretch for__________ seconds. 4. Slowly release your toes and return to the starting position. Repeat __________ times. Complete this exercise __________ times a day. Gastrocnemius stretch, standing This exercise is also called a calf (gastroc) stretch. It stretches the muscles in the back of the upper calf. 1. Stand with your hands against a wall. 2. Extend your left / right leg behind you, and bend your front knee slightly. 3. Keeping your heels on the floor and your back knee straight, shift  your weight toward the wall. Do not arch your back. You should feel a gentle stretch in your upper left / right calf. 4. Hold this position for __________ seconds. Repeat __________ times. Complete this exercise __________ times a day. Soleus stretch, standing This exercise is also called a calf (soleus) stretch. It stretches the muscles in the back of the lower calf. 1. Stand with your hands against a wall. 2. Extend your left / right leg behind you, and bend your front knee slightly. 3. Keeping your heels on the floor, bend your back knee and shift your weight slightly over your back leg. You should feel a gentle stretch deep in your lower calf. 4. Hold this position for __________ seconds. Repeat __________ times. Complete this exercise __________ times a day. Gastroc and soleus stretch, standing step This exercise stretches the muscles in the back of the lower leg. These muscles are in the upper calf (gastrocnemius) and the lower calf (soleus). 1. Stand with the ball of your left / right foot on a step. The ball of your foot is on the walking surface, right under your toes. 2. Keep your other foot firmly on the same step. 3. Hold on to the wall or a railing for balance. 4. Slowly lift your other foot, allowing your body weight to press your left / right heel down over the edge of the step. You should feel a stretch in your left / right calf. 5. Hold this position for __________ seconds. 6. Return both feet to the step. 7. Repeat this exercise  with a slight bend in your left / right knee. Repeat __________ times with your left / right knee straight and __________ times with your left / right knee bent. Complete this exercise __________ times a day. Balance exercise This exercise builds your balance and strength control of your arch to help take pressure off your plantar fascia. Single leg stand If this exercise is too easy, you can try it with your eyes closed or while standing on a pillow.  1. Without shoes, stand near a railing or in a doorway. You may hold on to the railing or door frame as needed. 2. Stand on your left / right foot. Keep your big toe down on the floor and try to keep your arch lifted. Do not let your foot roll inward. 3. Hold this position for __________ seconds. Repeat __________ times. Complete this exercise __________ times a day. This information is not intended to replace advice given to you by your health care provider. Make sure you discuss any questions you have with your health care provider. Document Released: 04/03/2005 Document Revised: 07/25/2018 Document Reviewed: 01/30/2018 Elsevier Patient Education  2020 [employer].

## 2018-10-30 ENCOUNTER — Other Ambulatory Visit: Payer: Self-pay | Admitting: Family Medicine

## 2018-11-07 ENCOUNTER — Other Ambulatory Visit: Payer: Self-pay | Admitting: Family Medicine

## 2018-11-07 NOTE — Telephone Encounter (Signed)
Last OV 10/28/18 Zolpidem last filled 06/25/18 #15 with 3

## 2018-11-22 ENCOUNTER — Other Ambulatory Visit: Payer: Self-pay | Admitting: Physician Assistant

## 2018-11-22 ENCOUNTER — Telehealth: Payer: Self-pay | Admitting: *Deleted

## 2018-11-22 DIAGNOSIS — M722 Plantar fascial fibromatosis: Secondary | ICD-10-CM

## 2018-11-22 MED ORDER — MELOXICAM 15 MG PO TABS: 15.0000 mg | ORAL_TABLET | Freq: Every day | ORAL | 0 refills | Status: DC

## 2018-11-22 NOTE — Telephone Encounter (Signed)
Podiatry assessment was recommended if not resolving. Would recommend this if not resolving. I will send in 15 tablets for him. No further refill without repeat assessment.

## 2018-11-22 NOTE — Telephone Encounter (Signed)
Patient is aware that medication is being sent in. He states if he is not better in the next week or so that he will call back for the referral to podiatry.

## 2018-11-22 NOTE — Telephone Encounter (Signed)
Patient called stating that he was seen in July and was given meloxicam for plantar fasciitis. He states that he has 4 pills left and he is going out of town and worried that he may run out of medication while gone. He is still having some tenderness and wondered if he could get more before going out of town.

## 2018-12-19 ENCOUNTER — Telehealth: Payer: Self-pay | Admitting: *Deleted

## 2018-12-19 NOTE — Telephone Encounter (Signed)
Wife called in and states that she is concerned about patient's drinking. She states that he is trying to hide it, but he is drinking vodka excessively.  She states he will go a few days with nothing, but then has a string of days where it is excessive and he is drunk.  Has fallen, passed/blacked out and been found in the floor.   Her daughter-in-law set up a family therapy meeting at [facility] in [place] and everyone agreed to go except patient.  They were advised to give him some requirements, and they are writing letters to him explaining why they are concerned and why they want him to stop drinking.   Son has stated that grandchildren are no longer allowed at the house or around him for any reason until he gets it under control.  They have also offered wife a place to live, but she does not want to leave unless he absolutely refuses help.  She would like to know if Dr. Birdie Riddle has any recommendations for anyone for  Him to talk to. She states the [facility] will see him, but it's more of a group setting and she does not feel like she can get him to do that.   She is wanting options to give to him before making decisions about her future.   She states that patient has an appointment in October, but she really does not want to wait that long to get this addressed. She states that she knows patient will be mad if he finds out she called about this, however she is not worried about him being mad - she said she realizes that something needs to change and she just wants to help him.   Wife's callback number for advice/questions: [phone]

## 2018-12-19 NOTE — Telephone Encounter (Signed)
Patient's wife called back after receiving voicemail from [facility]. Appt has been scehduled Sept. 17th at 11:30.

## 2018-12-19 NOTE — Telephone Encounter (Signed)
I am happy to help w/ this situation but unfortunately, I cannot force pt to seek help.  We can certainly schedule a visit for week of 9/14 to discuss family's concerns but pt will need to be present.  I strongly encourage them to call [facility] [phone]) or Behavioral Health to get him the help he needs.

## 2018-12-19 NOTE — Telephone Encounter (Signed)
Patient wife notified of PCP recommendations and is agreement and expresses an understanding.   [place] for [facility] to Discuss results / PCP recommendations / Schedule patient.

## 2018-12-20 DIAGNOSIS — H2513 Age-related nuclear cataract, bilateral: Secondary | ICD-10-CM | POA: Diagnosis not present

## 2019-01-02 ENCOUNTER — Encounter: Payer: Self-pay | Admitting: Family Medicine

## 2019-01-02 ENCOUNTER — Ambulatory Visit: Payer: BC Managed Care – PPO | Admitting: Family Medicine

## 2019-01-02 ENCOUNTER — Other Ambulatory Visit: Payer: Self-pay

## 2019-01-02 VITALS — BP 128/86 | HR 80 | Temp 97.9°F | Resp 16 | Ht 70.0 in | Wt 168.2 lb

## 2019-01-02 DIAGNOSIS — F1010 Alcohol abuse, uncomplicated: Secondary | ICD-10-CM | POA: Diagnosis not present

## 2019-01-02 DIAGNOSIS — M722 Plantar fascial fibromatosis: Secondary | ICD-10-CM | POA: Diagnosis not present

## 2019-01-02 NOTE — Assessment & Plan Note (Signed)
New to provider, recurrent problem for pt.  He would like podiatry referral.  Referral placed.

## 2019-01-02 NOTE — Progress Notes (Signed)
   Subjective:    Patient ID: Martin Malone, male    DOB: [birth year], 63 y.o.   MRN: [id number]  HPI Alcohol abuse- pt is down 17 lbs, wife reports low carb diet 'we've been trying'.  Walking 5-7 miles/day.  Pt reports eating 'a good breakfast, a good lunch but I don't eat a lot in the evenings'.  'I need help staying off of it'.  Pt has had anything to drink in 3 weeks.  Prior to that went 6 weeks w/o drinking.  'I have no desire to ever drink again'.  Wife reports when he starts drinking he 'can't stop until something bad happens'.  Often results in a fall.  Wife found him on the floor, unconscious and had to call 911.  Drinking occurs in binge episodes.  Son is now involved- children are not allowed to be alone w/ pt.  Pt hides his drinking and lies about drinking.  Pt restarted b/c he thought 'I had done really well for 6 weeks and deserved a reward'.  This has been an ongoing issue but worsening over the last year.  Pt is convinced he can do this on his own.  R foot pain- dx'd as plantar fasciitis.  tx'd w/ Meloxicam.  Continues to have pain in his heel though not as severe.   Review of Systems For ROS see HPI     Objective:   Physical Exam Vitals signs reviewed.  Constitutional:      General: He is not in acute distress.    Appearance: Normal appearance.  HENT:     Head: Normocephalic and atraumatic.  Skin:    General: Skin is warm and dry.     Coloration: Skin is not jaundiced.  Neurological:     General: No focal deficit present.     Mental Status: He is alert and oriented to person, place, and time.  Psychiatric:        Mood and Affect: Mood normal.        Behavior: Behavior normal.     Comments: Very little insight into his addiction           Assessment & Plan:

## 2019-01-02 NOTE — Patient Instructions (Addendum)
Follow up as scheduled- sooner if needed We'll call you with your Podiatry appt for the foot pain I am proud of you for committing to stop drinking Please call and set up a counseling appt with one of the following ADS [phone] [place] [phone] [place] [phone] Please come up with an action plan for you to do when you are feeling the need to drink Call with any questions or concerns Hang in there!!!

## 2019-01-02 NOTE — Assessment & Plan Note (Signed)
New.  Pt reports he has been drinking for over 40 yrs.  His wife has been aware of this but reports that in the last year the situation has worsened.  He is lying about his drinking, drinking in secret, hiding his alcohol.  Will binge drink when he does drink and reports he is unable to stop until there is some sort of consequence.  He reports he has been able to stop 'no problem' for 3-6 weeks at a time.  Has never had withdrawal sxs.  Is convinced he does not need an alcohol treatment program and that he is now committed to doing this on his own since wife and son are upset and grandchildren are no longer allowed to visit.  Stressed that trying to deal with an addiction on his own is very difficult and not often successful but pt is firm in his decision.  Encouraged he and his wife to come up w/ an action plan when he feels the need to drink.  Apparently he will walk when this occurs.  Encouraged them to think of other options in case walking is not possible at the time.  Actually assigned this as HW- we shall see.  As pt is not in danger of seizing or detoxing at this time, he does not require medical oversight but again encouraged counseling or treatment program.  Pt has upcoming CPE in 3 weeks will follow closely.  Total time spent w/ pt 50 minutes, >50% spent counseling

## 2019-01-13 ENCOUNTER — Other Ambulatory Visit: Payer: Self-pay

## 2019-01-13 ENCOUNTER — Encounter: Payer: Self-pay | Admitting: Podiatry

## 2019-01-13 ENCOUNTER — Ambulatory Visit: Payer: BC Managed Care – PPO | Admitting: Podiatry

## 2019-01-13 ENCOUNTER — Ambulatory Visit (INDEPENDENT_AMBULATORY_CARE_PROVIDER_SITE_OTHER): Payer: BC Managed Care – PPO

## 2019-01-13 VITALS — BP 140/88 | HR 82

## 2019-01-13 DIAGNOSIS — M7731 Calcaneal spur, right foot: Secondary | ICD-10-CM

## 2019-01-13 DIAGNOSIS — M722 Plantar fascial fibromatosis: Secondary | ICD-10-CM | POA: Diagnosis not present

## 2019-01-13 MED ORDER — METHYLPREDNISOLONE 4 MG PO TBPK: ORAL_TABLET | ORAL | 0 refills | Status: DC

## 2019-01-13 MED ORDER — MELOXICAM 15 MG PO TABS: 15.0000 mg | ORAL_TABLET | Freq: Every day | ORAL | 0 refills | Status: DC

## 2019-01-13 NOTE — Progress Notes (Signed)
Subjective:   Patient ID: Martin Malone, male   DOB: 63 y.o.   MRN: [id number]   HPI 63 year old male presents the office today for concerns of right heel pain.  He states that he is been having pain for the last several months.  He gets pain in the bottom of the heel but he states it was going in the arch of the foot.  He was meloxicam that helped resolve the arch pain however he still gets pain around the heel.  Symptoms started one morning when he woke up.  He denies any recent injury.  No swelling.  Pain is becoming more consistent especially after being on his feet all day.  He does walk several miles a day.  No radiating pain or weakness.   Review of Systems  All other systems reviewed and are negative.  Past Medical History:  Diagnosis Date  . GERD (gastroesophageal reflux disease)   . Hyperlipidemia   . Rib fracture     Past Surgical History:  Procedure Laterality Date  . TOTAL KNEE ARTHROPLASTY  2006     Current Outpatient Medications:  .  aspirin 81 MG tablet, Take 81 mg by mouth daily., Disp: , Rfl:  .  atorvastatin (LIPITOR) 10 MG tablet, TAKE 1 TABLET BY MOUTH EVERYDAY AT BEDTIME, Disp: 90 tablet, Rfl: 1 .  glucosamine-chondroitin 500-400 MG tablet, Take 1 tablet by mouth daily., Disp: , Rfl:  .  KRILL OIL PO, Take 2 tablets by mouth daily., Disp: , Rfl:  .  meloxicam (MOBIC) 15 MG tablet, Take 1 tablet (15 mg total) by mouth daily., Disp: 14 tablet, Rfl: 0 .  methylPREDNISolone (MEDROL DOSEPAK) 4 MG TBPK tablet, Take as directed, Disp: 21 tablet, Rfl: 0 .  Multiple Vitamin (MULTIVITAMIN) tablet, Take 1 tablet by mouth daily., Disp: , Rfl:   No Known Allergies      Objective:  Physical Exam  General: AAO x3, NAD  Dermatological: Skin is warm, dry and supple bilateral. Nails x 10 are well manicured; remaining integument appears unremarkable at this time. There are no open sores, no preulcerative lesions, no rash or signs of infection present.  Vascular:  Dorsalis Pedis artery and Posterior Tibial artery pedal pulses are 2/4 bilateral with immedate capillary fill time. Pedal hair growth present. No varicosities and no lower extremity edema present bilateral. There is no pain with calf compression, swelling, warmth, erythema.   Neruologic: Grossly intact via light touch bilateral.Protective threshold with Semmes Wienstein monofilament intact to all pedal sites bilateral. Negative tinel sign.   Musculoskeletal: There is tenderness palpation of the plantar medial tubercle of the calcaneus at insertion of plantar fascia on the right foot.  Plantar fascial peers to be intact.  No pain with Achilles tendon.  No other areas of discomfort no pain on the arch of the foot on medial band of plantar fascia.  Muscular strength 5/5 in all groups tested bilateral.  Gait: Unassisted, Nonantalgic.       Assessment:   Right heel pain, plantar fasciitis     Plan:  -Treatment options discussed including all alternatives, risks, and complications -Etiology of symptoms were discussed -X-rays were obtained and reviewed with the patient. Inferior calcaneal spurring is present.  No evidence of acute fracture. -Offered steroid injection he wants to hold off.  Prescribed a Medrol Dosepak.  Once complete will start meloxicam.  Sent both the pharmacy today discussed side effects.  If no improvement will do steroid injection next appointment. -Plantar fascial brace  dispensed -Continue stretching, icing daily. -Continue with supportive shoes and also discussed orthotics.  Return in about 4 weeks (around 02/10/2019).  Trula Slade DPM

## 2019-01-13 NOTE — Patient Instructions (Signed)
Start with the medrol dose pack and once complete you can start the mobic/meloxicam   Plantar Fasciitis (Heel Spur Syndrome) with Rehab The plantar fascia is a fibrous, ligament-like, soft-tissue structure that spans the bottom of the foot. Plantar fasciitis is a condition that causes pain in the foot due to inflammation of the tissue. SYMPTOMS   Pain and tenderness on the underneath side of the foot.  Pain that worsens with standing or walking. CAUSES  Plantar fasciitis is caused by irritation and injury to the plantar fascia on the underneath side of the foot. Common mechanisms of injury include:  Direct trauma to bottom of the foot.  Damage to a small nerve that runs under the foot where the main fascia attaches to the heel bone.  Stress placed on the plantar fascia due to bone spurs. RISK INCREASES WITH:   Activities that place stress on the plantar fascia (running, jumping, pivoting, or cutting).  Poor strength and flexibility.  Improperly fitted shoes.  Tight calf muscles.  Flat feet.  Failure to warm-up properly before activity.  Obesity. PREVENTION  Warm up and stretch properly before activity.  Allow for adequate recovery between workouts.  Maintain physical fitness:  Strength, flexibility, and endurance.  Cardiovascular fitness.  Maintain a health body weight.  Avoid stress on the plantar fascia.  Wear properly fitted shoes, including arch supports for individuals who have flat feet.  PROGNOSIS  If treated properly, then the symptoms of plantar fasciitis usually resolve without surgery. However, occasionally surgery is necessary.  RELATED COMPLICATIONS   Recurrent symptoms that may result in a chronic condition.  Problems of the lower back that are caused by compensating for the injury, such as limping.  Pain or weakness of the foot during push-off following surgery.  Chronic inflammation, scarring, and partial or complete fascia tear,  occurring more often from repeated injections.  TREATMENT  Treatment initially involves the use of ice and medication to help reduce pain and inflammation. The use of strengthening and stretching exercises may help reduce pain with activity, especially stretches of the Achilles tendon. These exercises may be performed at home or with a therapist. Your caregiver may recommend that you use heel cups of arch supports to help reduce stress on the plantar fascia. Occasionally, corticosteroid injections are given to reduce inflammation. If symptoms persist for greater than 6 months despite non-surgical (conservative), then surgery may be recommended.   MEDICATION   If pain medication is necessary, then nonsteroidal anti-inflammatory medications, such as aspirin and ibuprofen, or other minor pain relievers, such as acetaminophen, are often recommended.  Do not take pain medication within 7 days before surgery.  Prescription pain relievers may be given if deemed necessary by your caregiver. Use only as directed and only as much as you need.  Corticosteroid injections may be given by your caregiver. These injections should be reserved for the most serious cases, because they may only be given a certain number of times.  HEAT AND COLD  Cold treatment (icing) relieves pain and reduces inflammation. Cold treatment should be applied for 10 to 15 minutes every 2 to 3 hours for inflammation and pain and immediately after any activity that aggravates your symptoms. Use ice packs or massage the area with a piece of ice (ice massage).  Heat treatment may be used prior to performing the stretching and strengthening activities prescribed by your caregiver, physical therapist, or athletic trainer. Use a heat pack or soak the injury in warm water.  [place]  CARE IF:  Treatment seems to offer no benefit, or the condition worsens.  Any medications produce adverse side effects.  EXERCISES- RANGE OF  MOTION (ROM) AND STRETCHING EXERCISES - Plantar Fasciitis (Heel Spur Syndrome) These exercises may help you when beginning to rehabilitate your injury. Your symptoms may resolve with or without further involvement from your physician, physical therapist or athletic trainer. While completing these exercises, remember:   Restoring tissue flexibility helps normal motion to return to the joints. This allows healthier, less painful movement and activity.  An effective stretch should be held for at least 30 seconds.  A stretch should never be painful. You should only feel a gentle lengthening or release in the stretched tissue.  RANGE OF MOTION - Toe Extension, Flexion  Sit with your right / left leg crossed over your opposite knee.  Grasp your toes and gently pull them back toward the top of your foot. You should feel a stretch on the bottom of your toes and/or foot.  Hold this stretch for 10 seconds.  Now, gently pull your toes toward the bottom of your foot. You should feel a stretch on the top of your toes and or foot.  Hold this stretch for 10 seconds. Repeat  times. Complete this stretch 3 times per day.   RANGE OF MOTION - Ankle Dorsiflexion, Active Assisted  Remove shoes and sit on a chair that is preferably not on a carpeted surface.  Place right / left foot under knee. Extend your opposite leg for support.  Keeping your heel down, slide your right / left foot back toward the chair until you feel a stretch at your ankle or calf. If you do not feel a stretch, slide your bottom forward to the edge of the chair, while still keeping your heel down.  Hold this stretch for 10 seconds. Repeat 3 times. Complete this stretch 2 times per day.   STRETCH  Gastroc, Standing  Place hands on wall.  Extend right / left leg, keeping the front knee somewhat bent.  Slightly point your toes inward on your back foot.  Keeping your right / left heel on the floor and your knee straight, shift  your weight toward the wall, not allowing your back to arch.  You should feel a gentle stretch in the right / left calf. Hold this position for 10 seconds. Repeat 3 times. Complete this stretch 2 times per day.  STRETCH  Soleus, Standing  Place hands on wall.  Extend right / left leg, keeping the other knee somewhat bent.  Slightly point your toes inward on your back foot.  Keep your right / left heel on the floor, bend your back knee, and slightly shift your weight over the back leg so that you feel a gentle stretch deep in your back calf.  Hold this position for 10 seconds. Repeat 3 times. Complete this stretch 2 times per day.  STRETCH  Gastrocsoleus, Standing  Note: This exercise can place a lot of stress on your foot and ankle. Please complete this exercise only if specifically instructed by your caregiver.   Place the ball of your right / left foot on a step, keeping your other foot firmly on the same step.  Hold on to the wall or a rail for balance.  Slowly lift your other foot, allowing your body weight to press your heel down over the edge of the step.  You should feel a stretch in your right / left calf.  Hold this  position for 10 seconds.  Repeat this exercise with a slight bend in your right / left knee. Repeat 3 times. Complete this stretch 2 times per day.   STRENGTHENING EXERCISES - Plantar Fasciitis (Heel Spur Syndrome)  These exercises may help you when beginning to rehabilitate your injury. They may resolve your symptoms with or without further involvement from your physician, physical therapist or athletic trainer. While completing these exercises, remember:   Muscles can gain both the endurance and the strength needed for everyday activities through controlled exercises.  Complete these exercises as instructed by your physician, physical therapist or athletic trainer. Progress the resistance and repetitions only as guided.  STRENGTH - Towel Curls  Sit in  a chair positioned on a non-carpeted surface.  Place your foot on a towel, keeping your heel on the floor.  Pull the towel toward your heel by only curling your toes. Keep your heel on the floor. Repeat 3 times. Complete this exercise 2 times per day.  STRENGTH - Ankle Inversion  Secure one end of a rubber exercise band/tubing to a fixed object (table, pole). Loop the other end around your foot just before your toes.  Place your fists between your knees. This will focus your strengthening at your ankle.  Slowly, pull your big toe up and in, making sure the band/tubing is positioned to resist the entire motion.  Hold this position for 10 seconds.  Have your muscles resist the band/tubing as it slowly pulls your foot back to the starting position. Repeat 3 times. Complete this exercises 2 times per day.  Document Released: 04/03/2005 Document Revised: 06/26/2011 Document Reviewed: 07/16/2008 [facility] Patient Information 2014 [place], [place].

## 2019-01-23 ENCOUNTER — Ambulatory Visit (INDEPENDENT_AMBULATORY_CARE_PROVIDER_SITE_OTHER): Payer: BC Managed Care – PPO | Admitting: Family Medicine

## 2019-01-23 ENCOUNTER — Other Ambulatory Visit: Payer: Self-pay

## 2019-01-23 ENCOUNTER — Encounter: Payer: Self-pay | Admitting: Family Medicine

## 2019-01-23 VITALS — BP 121/78 | HR 62 | Temp 97.8°F | Resp 16 | Ht 70.0 in | Wt 162.0 lb

## 2019-01-23 DIAGNOSIS — Z0000 Encounter for general adult medical examination without abnormal findings: Secondary | ICD-10-CM | POA: Diagnosis not present

## 2019-01-23 DIAGNOSIS — E785 Hyperlipidemia, unspecified: Secondary | ICD-10-CM

## 2019-01-23 DIAGNOSIS — Z23 Encounter for immunization: Secondary | ICD-10-CM

## 2019-01-23 DIAGNOSIS — Z125 Encounter for screening for malignant neoplasm of prostate: Secondary | ICD-10-CM

## 2019-01-23 LAB — CBC WITH DIFFERENTIAL/PLATELET
Basophils Absolute: 0 10*3/uL (ref 0.0–0.1)
Basophils Relative: 0.8 % (ref 0.0–3.0)
Eosinophils Absolute: 0.1 10*3/uL (ref 0.0–0.7)
Eosinophils Relative: 2.1 % (ref 0.0–5.0)
HCT: 39.9 % (ref 39.0–52.0)
Hemoglobin: 13 g/dL (ref 13.0–17.0)
Lymphocytes Relative: 32.3 % (ref 12.0–46.0)
Lymphs Abs: 2 10*3/uL (ref 0.7–4.0)
MCHC: 32.7 g/dL (ref 30.0–36.0)
MCV: 96.5 fl (ref 78.0–100.0)
Monocytes Absolute: 0.4 10*3/uL (ref 0.1–1.0)
Monocytes Relative: 7.1 % (ref 3.0–12.0)
Neutro Abs: 3.5 10*3/uL (ref 1.4–7.7)
Neutrophils Relative %: 57.7 % (ref 43.0–77.0)
Platelets: 245 10*3/uL (ref 150.0–400.0)
RBC: 4.13 Mil/uL — ABNORMAL LOW (ref 4.22–5.81)
RDW: 14.5 % (ref 11.5–15.5)
WBC: 6.1 10*3/uL (ref 4.0–10.5)

## 2019-01-23 LAB — LIPID PANEL
Cholesterol: 207 mg/dL — ABNORMAL HIGH (ref 0–200)
HDL: 56.7 mg/dL (ref >39.00)
LDL Cholesterol: 140 mg/dL — ABNORMAL HIGH (ref 0–99)
NonHDL: 150.65
Total CHOL/HDL Ratio: 4
Triglycerides: 52 mg/dL (ref 0.0–149.0)
VLDL: 10.4 mg/dL (ref 0.0–40.0)

## 2019-01-23 LAB — BASIC METABOLIC PANEL
BUN: 20 mg/dL (ref 6–23)
CO2: 27 mEq/L (ref 19–32)
Calcium: 9.3 mg/dL (ref 8.4–10.5)
Chloride: 105 mEq/L (ref 96–112)
Creatinine, Ser: 1.13 mg/dL (ref 0.40–1.50)
GFR: 65.29 mL/min (ref >60.00)
Glucose, Bld: 77 mg/dL (ref 70–99)
Potassium: 4.1 mEq/L (ref 3.5–5.1)
Sodium: 139 mEq/L (ref 135–145)

## 2019-01-23 LAB — HEPATIC FUNCTION PANEL
ALT: 16 U/L (ref 0–53)
AST: 20 U/L (ref 0–37)
Albumin: 4.5 g/dL (ref 3.5–5.2)
Alkaline Phosphatase: 34 U/L — ABNORMAL LOW (ref 39–117)
Bilirubin, Direct: 0.1 mg/dL (ref 0.0–0.3)
Total Bilirubin: 0.5 mg/dL (ref 0.2–1.2)
Total Protein: 6.7 g/dL (ref 6.0–8.3)

## 2019-01-23 LAB — TSH: TSH: 1.84 u[IU]/mL (ref 0.35–4.50)

## 2019-01-23 LAB — PSA: PSA: 2.46 ng/mL (ref 0.10–4.00)

## 2019-01-23 NOTE — Patient Instructions (Signed)
Follow up in 6 months to recheck cholesterol We'll notify you of your lab results and make any changes if needed Keep up the good work!  You look great! I'm SO proud of your sobriety!!! Call with any questions or concerns Stay Safe!!!

## 2019-01-23 NOTE — Progress Notes (Signed)
   Subjective:    Patient ID: Martin Malone, male    DOB: 05/17/1955, 63 y.o.   MRN: [id number]  HPI CPE- UTD on colonoscopy. Due for flu and Tdap.  Pt is down 6 lbs since last visit- walking and low carb diet.  No drinking since last visit.     Review of Systems Patient reports no vision/hearing changes, anorexia, fever ,adenopathy, persistant/recurrent hoarseness, swallowing issues, chest pain, palpitations, edema, persistant/recurrent cough, hemoptysis, dyspnea (rest,exertional, paroxysmal nocturnal), gastrointestinal  bleeding (melena, rectal bleeding), abdominal pain, excessive heart burn, GU symptoms (dysuria, hematuria, voiding/incontinence issues) syncope, focal weakness, memory loss, numbness & tingling, skin/hair/nail changes, depression, anxiety, abnormal bruising/bleeding, musculoskeletal symptoms/signs.     Objective:   Physical Exam BP 121/78   Pulse 62   Temp 97.8 F (36.6 C) (Tympanic)   Resp 16   Ht 5\' 10"  (1.778 m)   Wt 162 lb (73.5 kg)   SpO2 98%   BMI 23.24 kg/m   General Appearance:    Alert, cooperative, no distress, appears stated age  Head:    Normocephalic, without obvious abnormality, atraumatic  Eyes:    PERRL, conjunctiva/corneas clear, EOM's intact, fundi    benign, both eyes       Ears:    Normal TM's and external ear canals, both ears  Nose:   Deferred due to COVID  Throat:   Neck:   Supple, symmetrical, trachea midline, no adenopathy;       thyroid:  No enlargement/tenderness/nodules  Back:     Symmetric, no curvature, ROM normal, no CVA tenderness  Lungs:     Clear to auscultation bilaterally, respirations unlabored  Chest wall:    No tenderness or deformity  Heart:    Regular rate and rhythm, S1 and S2 normal, no murmur, rub   or gallop  Abdomen:     Soft, non-tender, bowel sounds active all four quadrants,    no masses, no organomegaly  Genitalia:    Normal male without lesion, discharge or tenderness  Rectal:    Normal tone, normal  prostate, no masses or tenderness  Extremities:   Extremities normal, atraumatic, no cyanosis or edema  Pulses:   2+ and symmetric all extremities  Skin:   Skin color, texture, turgor normal, no rashes or lesions  Lymph nodes:   Cervical, supraclavicular, and axillary nodes normal  Neurologic:   CNII-XII intact. Normal strength, sensation and reflexes      throughout          Assessment & Plan:

## 2019-01-23 NOTE — Assessment & Plan Note (Signed)
Pt's PE WNL.  UTD on colonoscopy.  Will get flu and Tdap today.  Check labs.  Anticipatory guidance provided.

## 2019-01-23 NOTE — Assessment & Plan Note (Signed)
Chronic problem.  Tolerating statin w/o difficulty.  Check labs.  Adjust meds prn  

## 2019-02-10 ENCOUNTER — Encounter: Payer: Self-pay | Admitting: Podiatry

## 2019-02-10 ENCOUNTER — Ambulatory Visit: Payer: BC Managed Care – PPO | Admitting: Podiatry

## 2019-02-10 ENCOUNTER — Other Ambulatory Visit: Payer: Self-pay

## 2019-02-10 DIAGNOSIS — M722 Plantar fascial fibromatosis: Secondary | ICD-10-CM

## 2019-02-10 MED ORDER — MELOXICAM 15 MG PO TABS: 15.0000 mg | ORAL_TABLET | Freq: Every day | ORAL | 0 refills | Status: AC

## 2019-02-10 NOTE — Patient Instructions (Signed)

## 2019-02-10 NOTE — Progress Notes (Signed)
Subjective: 63 year old male presents the office for evaluation of right heel pain, plantar fasciitis.  He states overall he is doing better but there is still 1 area next tender points on the plantar medial heel.  He states that the steroids well the anti-inflammatory was helpful.  He still been stretching arthritis.  Icing was not helpful.  No radiating pain or weakness. Denies any systemic complaints such as fevers, chills, nausea, vomiting. No acute changes since last appointment, and no other complaints at this time.   Objective: AAO x3, NAD DP/PT pulses palpable bilaterally, CRT less than 3 seconds Tenderness or pain on the plantar medial tubercle of the calcaneus at insertion of plantar fascia.  Plantar fascial appears to be intact.  No pain with Achilles tendon.  There is no pain with lateral compression of the calcaneus.  No other areas of tenderness identified today. No pain with calf compression, swelling, warmth, erythema  Assessment: 63 year old male right heel pain, clinical history  Plan: -All treatment options discussed with the patient including all alternatives, risks, complications.  -Steroid injection performed.  See procedure note below.  Continue stretching, ice daily.  Refill meloxicam.  Discussed adding an insert inside of his shoe as well as changing shoes. -Patient encouraged to call the office with any questions, concerns, change in symptoms.   Procedure: Injection Tendon/Ligament Discussed alternatives, risks, complications and verbal consent was obtained.  Location: RIGHT plantar fascia at the glabrous junction; medial approach. Skin Prep: Alcohol  Injectate: 0.5cc 0.5% marcaine plain, 0.5 cc 2% lidocaine plain and, 1 cc kenalog 10. Disposition: Patient tolerated procedure well. Injection site dressed with a band-aid.  Post-injection care was discussed and return precautions discussed.   Trula Slade DPM

## 2019-02-12 ENCOUNTER — Telehealth: Payer: Self-pay

## 2019-02-12 DIAGNOSIS — M25551 Pain in right hip: Secondary | ICD-10-CM

## 2019-02-12 DIAGNOSIS — M25511 Pain in right shoulder: Secondary | ICD-10-CM

## 2019-02-12 NOTE — Telephone Encounter (Signed)
[place] for ortho referral for R shoulder pain and R hip pain

## 2019-02-12 NOTE — Telephone Encounter (Signed)
Patient called in complaining of right shoulder soreness and pain in his right hip/buttocks. Stated this issue was previously discussed at his CPE, wants to move forward with whatever PCP recommends to find the source of issue. Please advise

## 2019-02-12 NOTE — Telephone Encounter (Signed)
This referral was placed today.  

## 2019-02-12 NOTE — Addendum Note (Signed)
Addended by: Davis Gourd on: 02/12/2019 09:32 AM   Modules accepted: Orders

## 2019-02-13 ENCOUNTER — Ambulatory Visit: Payer: Self-pay

## 2019-02-13 ENCOUNTER — Encounter: Payer: Self-pay | Admitting: Orthopaedic Surgery

## 2019-02-13 ENCOUNTER — Ambulatory Visit (INDEPENDENT_AMBULATORY_CARE_PROVIDER_SITE_OTHER): Payer: BC Managed Care – PPO | Admitting: Orthopaedic Surgery

## 2019-02-13 ENCOUNTER — Other Ambulatory Visit: Payer: Self-pay

## 2019-02-13 DIAGNOSIS — M25559 Pain in unspecified hip: Secondary | ICD-10-CM

## 2019-02-13 DIAGNOSIS — M25551 Pain in right hip: Secondary | ICD-10-CM

## 2019-02-13 DIAGNOSIS — G8929 Other chronic pain: Secondary | ICD-10-CM | POA: Diagnosis not present

## 2019-02-13 DIAGNOSIS — M25511 Pain in right shoulder: Secondary | ICD-10-CM

## 2019-02-13 MED ORDER — METHYLPREDNISOLONE ACETATE 40 MG/ML IJ SUSP
40.0000 mg | INTRAMUSCULAR | Status: AC | PRN
  Administered 2019-02-13: 40 mg via INTRA_ARTICULAR

## 2019-02-13 MED ORDER — BUPIVACAINE HCL 0.5 % IJ SOLN
3.0000 mL | INTRAMUSCULAR | Status: AC | PRN
  Administered 2019-02-13: 3 mL via INTRA_ARTICULAR

## 2019-02-13 MED ORDER — LIDOCAINE HCL 1 % IJ SOLN
3.0000 mL | INTRAMUSCULAR | Status: AC | PRN
  Administered 2019-02-13: 3 mL

## 2019-02-13 NOTE — Progress Notes (Signed)
Office Visit Note   Patient: Martin Malone           Date of Birth: Aug 09, 1955           MRN: [id number] Visit Date: 02/13/2019              Requested by: Midge Minium, MD [address] [place],  [place] [address] PCP: Midge Minium, MD   Assessment & Plan: Visit Diagnoses:  1. Chronic right shoulder pain   2. Hip pain     Plan: Impression is right shoulder pain possible bursitis or rotator cuff tendinitis and right hip pain suspect symptomatic short external rotators.  For the shoulder subacromial injection was performed today.  Patient will keep track of his response to the injection.  He will call if he does not improve.  For the right hip.  I have sent him to physical therapy to stretch out his short external rotators and to strengthen as well.  Questions encouraged and answered.  Follow-up as needed.  Follow-Up Instructions: Return if symptoms worsen or fail to improve.   Orders:  Orders Placed This Encounter  Procedures  . XR Shoulder Right  . XR HIP UNILAT W OR W/O PELVIS 2-3 VIEWS RIGHT   No orders of the defined types were placed in this encounter.     Procedures: Large Joint Inj: R subacromial bursa on 02/13/2019 4:25 PM Indications: pain Details: 22 G needle  Arthrogram: No  Medications: 3 mL lidocaine 1 %; 3 mL bupivacaine 0.5 %; 40 mg methylPREDNISolone acetate 40 MG/ML Outcome: tolerated well, no immediate complications Consent was given by the patient. Patient was prepped and draped in the usual sterile fashion.       Clinical Data: No additional findings.   Subjective: Chief Complaint  Patient presents with  . Right Shoulder - Pain    Martin Malone is a very pleasant 63 year old gentleman who comes in for evaluation of right shoulder and right hip pain.  In terms of the right shoulder and he denies any definite injuries.  He was able to lift some weights yesterday.  He states that he has pain around his shoulder that is not really  localized to one area.  He has good range of motion.  Denies any true radicular symptoms.  He does endorse some tingling occasionally.  He recently took a prednisone Dosepak and some meloxicam which did not seem to help significantly.  For the right hip pain he localizes the pain to the posterior lateral buttock region.  Denies any radicular symptoms or back pain.  Denies any groin pain.  He states that it is worse at night.   Review of Systems  Constitutional: Negative.   All other systems reviewed and are negative.    Objective: Vital Signs: There were no vitals taken for this visit.  Physical Exam Vitals signs and nursing note reviewed.  Constitutional:      Appearance: He is well-developed.  HENT:     Head: Normocephalic and atraumatic.  Eyes:     Pupils: Pupils are equal, round, and reactive to light.  Neck:     Musculoskeletal: Neck supple.  Pulmonary:     Effort: Pulmonary effort is normal.  Abdominal:     Palpations: Abdomen is soft.  Musculoskeletal: Normal range of motion.  Skin:    General: Skin is warm.  Neurological:     Mental Status: He is alert and oriented to person, place, and time.  Psychiatric:  Behavior: Behavior normal.        Thought Content: Thought content normal.        Judgment: Judgment normal.     Ortho Exam Right shoulder exam shows normal painless range of motion without difficulty.  Rotator cuff is normal to manual muscle testing with some very mild discomfort with empty can testing.  He is slightly tender to the bicipital groove.  Otherwise exam is normal.  Right hip exam shows full range of motion without pain.  Negative Faber.  He has discomfort with palpation in the posterior lateral region of the proximal femur near the short external rotators. Specialty Comments:  No specialty comments available.  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  Otherwise x-rays were normal.    PMFS History: Patient Active Problem List   Diagnosis Date Noted  . Alcohol abuse 01/02/2019  . Plantar fasciitis of right foot 01/02/2019  . Overweight (BMI 25.0-29.9) 07/19/2018  . Left lateral epicondylitis 02/13/2016  . Lateral epicondylitis of right elbow 02/06/2014  . Peroneal tendinitis of right lower extremity 07/15/2013  . History of meniscal tear 07/15/2013  . BPV (benign positional vertigo) 05/28/2013  . Routine general medical examination at a health care facility 11/19/2012  . Hyperlipidemia 05/22/2012  . Family history of premature CAD 11/29/2011   Past Medical History:  Diagnosis Date  . GERD (gastroesophageal reflux disease)   . Hyperlipidemia   . Rib fracture     Family History  Problem Relation Age of Onset  . Coronary artery disease Brother   . Dementia Mother   . Pancreatitis Father     Past Surgical History:  Procedure Laterality Date  . TOTAL KNEE ARTHROPLASTY  2006   Social History   Occupational History  . Occupation: Teacher, adult education  Tobacco Use  . Smoking status: Former Smoker    Quit date: 04/17/1977    Years since quitting: 41.8  . Smokeless tobacco: Never Used  Substance and Sexual Activity  . Alcohol use: Yes    Alcohol/week: 2.0 standard drinks    Types: 2 Glasses of wine per week  . Drug use: Not on file  . Sexual activity: Not on file

## 2019-02-28 ENCOUNTER — Encounter: Payer: Self-pay | Admitting: Physician Assistant

## 2019-02-28 ENCOUNTER — Other Ambulatory Visit: Payer: Self-pay

## 2019-02-28 ENCOUNTER — Ambulatory Visit (INDEPENDENT_AMBULATORY_CARE_PROVIDER_SITE_OTHER): Payer: BC Managed Care – PPO | Admitting: Physician Assistant

## 2019-02-28 VITALS — Temp 96.7°F

## 2019-02-28 DIAGNOSIS — B349 Viral infection, unspecified: Secondary | ICD-10-CM

## 2019-02-28 NOTE — Patient Instructions (Signed)
Please keep hydrated and get plenty of rest.  If you develop fever, aches, headache -- can use Tylenol OTC if needed.   If you develop cough or chest congestion -- can use Mucinex or Delsym to help with this.  I am enrolling you in a MyChart symptom program to keep an eye on your symptoms. This definitely seems viral but so soon since onset of symptoms it is hard to tell what we are fully dealing with.   Keep quarantined until results are in.  Go to [email or web address] for updated information on testing sites.   If you note any severe worsening of symptoms or any significant shortness of breath, please go to the nearest Urgent Care or ER.

## 2019-02-28 NOTE — Progress Notes (Signed)
I have discussed the procedure for the virtual visit with the patient who has given consent to proceed with assessment and treatment.   Cola Gane S Konnor Vondrasek, CMA     

## 2019-02-28 NOTE — Progress Notes (Signed)
   Virtual Visit via Video   I connected with patient on 02/28/19 at  2:30 PM EST by a video enabled telemedicine application and verified that I am speaking with the correct person using two identifiers.  Location patient: Home Location provider: [facility], Office Persons participating in the virtual visit: Patient, Provider, [place] (Patina Moore)  I discussed the limitations of evaluation and management by telemedicine and the availability of in person appointments. The patient expressed understanding and agreed to proceed.  Subjective:   HPI:   Patient presents via Doxy[employer]Me today c/o chills and fatigue starting today at 12 o'clock. Denies body aches, nasal congestion, sinus pressure, cough. Denies anorexia or nausea. Is keeping well-hydrated. Denies change to bowel or bladder habits. Denies abdominal pain or low back pain. Denies sick contact. Denies recent travel but has been playing golf with some friends.   ROS:   See pertinent positives and negatives per HPI.  Patient Active Problem List   Diagnosis Date Noted  . Alcohol abuse 01/02/2019  . Plantar fasciitis of right foot 01/02/2019  . Overweight (BMI 25.0-29.9) 07/19/2018  . Left lateral epicondylitis 02/13/2016  . Lateral epicondylitis of right elbow 02/06/2014  . Peroneal tendinitis of right lower extremity 07/15/2013  . History of meniscal tear 07/15/2013  . BPV (benign positional vertigo) 05/28/2013  . Routine general medical examination at a health care facility 11/19/2012  . Hyperlipidemia 05/22/2012  . Family history of premature CAD 11/29/2011    Social History   Tobacco Use  . Smoking status: Former Smoker    Quit date: 04/17/1977    Years since quitting: 41.8  . Smokeless tobacco: Never Used  Substance Use Topics  . Alcohol use: Yes    Alcohol/week: 2.0 standard drinks    Types: 2 Glasses of wine per week    Current Outpatient Medications:  .  aspirin 81 MG tablet, Take 81 mg by mouth daily.,  Disp: , Rfl:  .  atorvastatin (LIPITOR) 10 MG tablet, TAKE 1 TABLET BY MOUTH EVERYDAY AT BEDTIME, Disp: 90 tablet, Rfl: 1 .  glucosamine-chondroitin 500-400 MG tablet, Take 1 tablet by mouth daily., Disp: , Rfl:  .  KRILL OIL PO, Take 2 tablets by mouth daily., Disp: , Rfl:  .  meloxicam (MOBIC) 15 MG tablet, Take 1 tablet (15 mg total) by mouth daily., Disp: 14 tablet, Rfl: 0 .  Multiple Vitamin (MULTIVITAMIN) tablet, Take 1 tablet by mouth daily., Disp: , Rfl:  .  zolpidem (AMBIEN) 10 MG tablet, Take 10 mg by mouth at bedtime as needed., Disp: , Rfl:   No Known Allergies  Objective:   Temp (!) 96.7 F (35.9 C) (Oral)   Patient is well-developed, well-nourished in no acute distress.  Resting comfortably at home.  Head is normocephalic, atraumatic.  No labored breathing.  Speech is clear and coherent with logical content.  Patient is alert and oriented at baseline.   Assessment and Plan:   1. Viral syndrome With only 2 hours since symptom onset very hard to distinguish what may be present. Seems viral in nature giving absence of other symptoms that would indicate bacterial infection. Concern for COVId giving he has been golfing with other people. Will have him get tested today. He is to update [place] with symptom progression. Reviewed common viral symptoms and proper OTC treatments for this. Handout sent to MyChart.    Leeanne Rio, PA-C 02/28/2019

## 2019-03-01 ENCOUNTER — Other Ambulatory Visit: Payer: Self-pay | Admitting: *Deleted

## 2019-03-01 DIAGNOSIS — Z20828 Contact with and (suspected) exposure to other viral communicable diseases: Secondary | ICD-10-CM

## 2019-03-01 DIAGNOSIS — Z1152 Encounter for screening for COVID-19: Secondary | ICD-10-CM

## 2019-03-01 DIAGNOSIS — Z20822 Contact with and (suspected) exposure to covid-19: Secondary | ICD-10-CM

## 2019-03-03 LAB — NOVEL CORONAVIRUS, NAA: SARS-CoV-2, NAA: NOT DETECTED

## 2019-04-03 DIAGNOSIS — L821 Other seborrheic keratosis: Secondary | ICD-10-CM | POA: Diagnosis not present

## 2019-04-03 DIAGNOSIS — D2272 Melanocytic nevi of left lower limb, including hip: Secondary | ICD-10-CM | POA: Diagnosis not present

## 2019-04-03 DIAGNOSIS — B078 Other viral warts: Secondary | ICD-10-CM | POA: Diagnosis not present

## 2019-04-03 DIAGNOSIS — L570 Actinic keratosis: Secondary | ICD-10-CM | POA: Diagnosis not present

## 2019-04-03 DIAGNOSIS — D225 Melanocytic nevi of trunk: Secondary | ICD-10-CM | POA: Diagnosis not present

## 2019-04-24 ENCOUNTER — Other Ambulatory Visit: Payer: Self-pay | Admitting: Family Medicine

## 2019-04-25 NOTE — Telephone Encounter (Signed)
Last OV 02/28/19 Zolpidem last filled 01/29/19 this does not have a qty on it.

## 2019-05-23 ENCOUNTER — Other Ambulatory Visit: Payer: Self-pay | Admitting: Family Medicine

## 2019-06-26 ENCOUNTER — Other Ambulatory Visit: Payer: Self-pay | Admitting: Family Medicine

## 2019-06-26 NOTE — Telephone Encounter (Signed)
Last OV 02/28/19 Zolpidem last filled 04/25/19 #15 with 1

## 2019-07-23 ENCOUNTER — Encounter: Payer: Self-pay | Admitting: Family Medicine

## 2019-07-23 ENCOUNTER — Telehealth (INDEPENDENT_AMBULATORY_CARE_PROVIDER_SITE_OTHER): Payer: BC Managed Care – PPO | Admitting: Family Medicine

## 2019-07-23 ENCOUNTER — Other Ambulatory Visit: Payer: Self-pay

## 2019-07-23 VITALS — HR 55 | Ht 69.0 in | Wt 150.0 lb

## 2019-07-23 DIAGNOSIS — E785 Hyperlipidemia, unspecified: Secondary | ICD-10-CM | POA: Diagnosis not present

## 2019-07-23 DIAGNOSIS — F1010 Alcohol abuse, uncomplicated: Secondary | ICD-10-CM

## 2019-07-23 NOTE — Progress Notes (Signed)
   Virtual Visit via Video   I connected with patient on 07/23/19 at  8:30 AM EDT by a video enabled telemedicine application and verified that I am speaking with the correct person using two identifiers.  Location patient: Home Location provider: [facility], Office Persons participating in the virtual visit: Patient, Provider, [place] (Jess B)  I discussed the limitations of evaluation and management by telemedicine and the availability of in person appointments. The patient expressed understanding and agreed to proceed.  Subjective:   HPI:   Hyperlipidemia- chronic problem, on Lipitor 10mg  daily.  Continues to exercise regularly- golf 1-2x/week and walking regularly (5-7 miles/day)  No CP, SOB, HAs, abd pain, N/V.  Was previously doing a high protein diet and cholesterol had increased.  Has gone back to a more moderate diet.  Alcohol Abuse- pt has not had a drink in 7+ months.  Pt is doing extremely well.  No cravings.  No issues at this time.  ROS:   See pertinent positives and negatives per HPI.  Patient Active Problem List   Diagnosis Date Noted  . Alcohol abuse 01/02/2019  . Plantar fasciitis of right foot 01/02/2019  . Overweight (BMI 25.0-29.9) 07/19/2018  . Left lateral epicondylitis 02/13/2016  . Lateral epicondylitis of right elbow 02/06/2014  . Peroneal tendinitis of right lower extremity 07/15/2013  . History of meniscal tear 07/15/2013  . BPV (benign positional vertigo) 05/28/2013  . Routine general medical examination at a health care facility 11/19/2012  . Hyperlipidemia 05/22/2012  . Family history of premature CAD 11/29/2011    Social History   Tobacco Use  . Smoking status: Former Smoker    Quit date: 04/17/1977    Years since quitting: 42.2  . Smokeless tobacco: Never Used  Substance Use Topics  . Alcohol use: Yes    Alcohol/week: 2.0 standard drinks    Types: 2 Glasses of wine per week    Current Outpatient Medications:  .  aspirin 81 MG  tablet, Take 81 mg by mouth daily., Disp: , Rfl:  .  atorvastatin (LIPITOR) 10 MG tablet, TAKE 1 TABLET BY MOUTH EVERYDAY AT BEDTIME, Disp: 90 tablet, Rfl: 1 .  glucosamine-chondroitin 500-400 MG tablet, Take 1 tablet by mouth daily., Disp: , Rfl:  .  KRILL OIL PO, Take 2 tablets by mouth daily., Disp: , Rfl:  .  meloxicam (MOBIC) 15 MG tablet, Take 1 tablet (15 mg total) by mouth daily., Disp: 14 tablet, Rfl: 0 .  Multiple Vitamin (MULTIVITAMIN) tablet, Take 1 tablet by mouth daily., Disp: , Rfl:  .  zolpidem (AMBIEN) 10 MG tablet, TAKE 1 TABLET BY MOUTH AT BEDTIME AS NEEDED FOR SLEEP**MAX PER INS**, Disp: 15 tablet, Rfl: 1  No Known Allergies  Objective:   Pulse (!) 55   Ht 5\' 9"  (1.753 m)   Wt 150 lb (68 kg)   BMI 22.15 kg/m   AAOx3, NAD NCAT, EOMI No obvious CN deficits Coloring WNL Pt is able to speak clearly, coherently without shortness of breath or increased work of breathing.  Thought process is linear.  Mood is appropriate.   Assessment and Plan:   Hyperlipidemia- chronic problem.  Tolerating statin w/o difficulty.  Exercising regularly.  Has resumed a more moderate diet as opposed to high protein.  Alcohol abuse- pt has been alcohol free for >7 months.  Applauded his efforts.  Will follow.   Annye Asa, MD 07/23/2019

## 2019-07-25 ENCOUNTER — Encounter: Payer: Self-pay | Admitting: General Practice

## 2019-07-25 ENCOUNTER — Ambulatory Visit (INDEPENDENT_AMBULATORY_CARE_PROVIDER_SITE_OTHER): Payer: BC Managed Care – PPO

## 2019-07-25 ENCOUNTER — Other Ambulatory Visit: Payer: Self-pay

## 2019-07-25 DIAGNOSIS — E785 Hyperlipidemia, unspecified: Secondary | ICD-10-CM

## 2019-07-25 LAB — HEPATIC FUNCTION PANEL
ALT: 14 U/L (ref 0–53)
AST: 18 U/L (ref 0–37)
Albumin: 4.3 g/dL (ref 3.5–5.2)
Alkaline Phosphatase: 51 U/L (ref 39–117)
Bilirubin, Direct: 0.1 mg/dL (ref 0.0–0.3)
Total Bilirubin: 0.5 mg/dL (ref 0.2–1.2)
Total Protein: 6.4 g/dL (ref 6.0–8.3)

## 2019-07-25 LAB — BASIC METABOLIC PANEL
BUN: 17 mg/dL (ref 6–23)
CO2: 29 mEq/L (ref 19–32)
Calcium: 9.1 mg/dL (ref 8.4–10.5)
Chloride: 102 mEq/L (ref 96–112)
Creatinine, Ser: 1.04 mg/dL (ref 0.40–1.50)
GFR: 71.74 mL/min (ref >60.00)
Glucose, Bld: 87 mg/dL (ref 70–99)
Potassium: 4.5 mEq/L (ref 3.5–5.1)
Sodium: 138 mEq/L (ref 135–145)

## 2019-07-25 LAB — LIPID PANEL
Cholesterol: 166 mg/dL (ref 0–200)
HDL: 62.9 mg/dL (ref >39.00)
LDL Cholesterol: 93 mg/dL (ref 0–99)
NonHDL: 102.79
Total CHOL/HDL Ratio: 3
Triglycerides: 49 mg/dL (ref 0.0–149.0)
VLDL: 9.8 mg/dL (ref 0.0–40.0)

## 2019-09-07 ENCOUNTER — Other Ambulatory Visit: Payer: Self-pay | Admitting: Family Medicine

## 2019-09-08 NOTE — Telephone Encounter (Signed)
Last OV 07/23/19 Zolpidem last filled 06/26/19 #15 with 1

## 2019-10-17 ENCOUNTER — Other Ambulatory Visit: Payer: Self-pay | Admitting: Family Medicine

## 2019-10-17 MED ORDER — ZOLPIDEM TARTRATE 10 MG PO TABS: ORAL_TABLET | ORAL | 1 refills | Status: DC

## 2019-10-17 MED ORDER — ATORVASTATIN CALCIUM 10 MG PO TABS: ORAL_TABLET | ORAL | 1 refills | Status: DC

## 2019-10-17 NOTE — Telephone Encounter (Signed)
Pt called in stating that he changed pharmacies to [facility] in [place] ( I have updated the pharmacy) He states that he has about a 30 day supply of the atorvastatin and the Zolpidem but would like to plan ahead and have a  90 day supply sent to the new pharmacy. Please advise

## 2019-10-17 NOTE — Telephone Encounter (Signed)
Please advise I have pended both. Per note on the Zolpidem it advises that 15 is the max qty.

## 2019-11-13 ENCOUNTER — Other Ambulatory Visit: Payer: Self-pay | Admitting: Family Medicine

## 2020-01-13 ENCOUNTER — Other Ambulatory Visit: Payer: Self-pay | Admitting: Family Medicine

## 2020-01-14 NOTE — Telephone Encounter (Signed)
Last OV 07/23/19 Zolpidem last filled 10/17/19 #15 with 1

## 2020-01-27 ENCOUNTER — Other Ambulatory Visit: Payer: Self-pay

## 2020-01-27 ENCOUNTER — Ambulatory Visit (INDEPENDENT_AMBULATORY_CARE_PROVIDER_SITE_OTHER): Payer: Medicare Other | Admitting: Family Medicine

## 2020-01-27 ENCOUNTER — Encounter: Payer: Self-pay | Admitting: Family Medicine

## 2020-01-27 VITALS — BP 112/76 | HR 56 | Temp 97.7°F | Resp 16 | Ht 69.0 in | Wt 158.1 lb

## 2020-01-27 DIAGNOSIS — E785 Hyperlipidemia, unspecified: Secondary | ICD-10-CM | POA: Diagnosis not present

## 2020-01-27 DIAGNOSIS — Z23 Encounter for immunization: Secondary | ICD-10-CM | POA: Diagnosis not present

## 2020-01-27 DIAGNOSIS — F1010 Alcohol abuse, uncomplicated: Secondary | ICD-10-CM

## 2020-01-27 DIAGNOSIS — Z125 Encounter for screening for malignant neoplasm of prostate: Secondary | ICD-10-CM

## 2020-01-27 DIAGNOSIS — Z0000 Encounter for general adult medical examination without abnormal findings: Secondary | ICD-10-CM | POA: Diagnosis not present

## 2020-01-27 LAB — HEPATIC FUNCTION PANEL
ALT: 19 U/L (ref 0–53)
AST: 22 U/L (ref 0–37)
Albumin: 4.6 g/dL (ref 3.5–5.2)
Alkaline Phosphatase: 46 U/L (ref 39–117)
Bilirubin, Direct: 0.1 mg/dL (ref 0.0–0.3)
Total Bilirubin: 0.5 mg/dL (ref 0.2–1.2)
Total Protein: 7.1 g/dL (ref 6.0–8.3)

## 2020-01-27 LAB — CBC WITH DIFFERENTIAL/PLATELET
Basophils Absolute: 0.1 10*3/uL (ref 0.0–0.1)
Basophils Relative: 0.7 % (ref 0.0–3.0)
Eosinophils Absolute: 0.2 10*3/uL (ref 0.0–0.7)
Eosinophils Relative: 2.3 % (ref 0.0–5.0)
HCT: 39.7 % (ref 39.0–52.0)
Hemoglobin: 13.2 g/dL (ref 13.0–17.0)
Lymphocytes Relative: 26 % (ref 12.0–46.0)
Lymphs Abs: 2.2 10*3/uL (ref 0.7–4.0)
MCHC: 33.3 g/dL (ref 30.0–36.0)
MCV: 94.5 fl (ref 78.0–100.0)
Monocytes Absolute: 0.5 10*3/uL (ref 0.1–1.0)
Monocytes Relative: 6.2 % (ref 3.0–12.0)
Neutro Abs: 5.3 10*3/uL (ref 1.4–7.7)
Neutrophils Relative %: 64.8 % (ref 43.0–77.0)
Platelets: 295 10*3/uL (ref 150.0–400.0)
RBC: 4.2 Mil/uL — ABNORMAL LOW (ref 4.22–5.81)
RDW: 14.9 % (ref 11.5–15.5)
WBC: 8.3 10*3/uL (ref 4.0–10.5)

## 2020-01-27 LAB — BASIC METABOLIC PANEL
BUN: 28 mg/dL — ABNORMAL HIGH (ref 6–23)
CO2: 31 mEq/L (ref 19–32)
Calcium: 9.3 mg/dL (ref 8.4–10.5)
Chloride: 103 mEq/L (ref 96–112)
Creatinine, Ser: 1.14 mg/dL (ref 0.40–1.50)
GFR: 67 mL/min (ref >60.00)
Glucose, Bld: 84 mg/dL (ref 70–99)
Potassium: 4.4 mEq/L (ref 3.5–5.1)
Sodium: 139 mEq/L (ref 135–145)

## 2020-01-27 LAB — LIPID PANEL
Cholesterol: 181 mg/dL (ref 0–200)
HDL: 68.8 mg/dL (ref >39.00)
LDL Cholesterol: 98 mg/dL (ref 0–99)
NonHDL: 112.41
Total CHOL/HDL Ratio: 3
Triglycerides: 70 mg/dL (ref 0.0–149.0)
VLDL: 14 mg/dL (ref 0.0–40.0)

## 2020-01-27 LAB — PSA, MEDICARE: PSA: 3.4 ng/ml (ref 0.10–4.00)

## 2020-01-27 LAB — TSH: TSH: 2.71 u[IU]/mL (ref 0.35–4.50)

## 2020-01-27 NOTE — Assessment & Plan Note (Signed)
Pt's PE WNL.  UTD on colonoscopy, Tdap, COVID.  Due for flu and prevnar- given today.  Check labs.  Encouraged healthy diet, continued exercise, ongoing sobriety, COVID booster.

## 2020-01-27 NOTE — Patient Instructions (Signed)
Follow up in 6 months to recheck cholesterol We'll notify you of your lab results and make any changes if needed Keep up the good work!  You look great! Let me know the date of your booster shot and we'll update the computer Call with any questions or concerns Stay Safe! Stay Healthy! Enjoy the beach!!!

## 2020-01-27 NOTE — Progress Notes (Signed)
Subjective:    Patient ID: Martin Malone, male    DOB: [birth year], 64 y.o.   MRN: [id number]  HPI Here today for Welcome to Medicare CPE.  Risk Factors: Hyperlipidemia- chronic problem, on Lipitor 10mg  daily.  Denies abd pain, N/V Alcohol abuse- pt has been alcohol free for >1 yr Physical Activity: exercising regularly, walking at least 60 minutes/day Fall Risk: low risk Depression: denies current sxs Hearing: normal to conversational tones, mildly decreased to whispered voice ADL's: independent Cognitive: normal linear thought process, memory and attention intact (mini-cog) Home Safety: safe at home, lives w/ wife Height, Weight, BMI, Visual Acuity: see vitals, vision corrected to 20/20 w/ glasses Counseling: UTD on colonoscopy, Tdap, COVID.  Due for flu Healthcare POA/Living Will: pt has both, we requested him to bring a copy of each Labs Ordered: See A&P Care Plan: See A&P   Patient Care Team    Relationship Specialty Notifications Start End  Midge Minium, MD PCP - General Family Medicine  05/22/12   Sherren Mocha, MD Consulting Physician Cardiology  12/01/14     Health Maintenance  Topic Date Due   PNA vac Low Risk Adult (1 of 2 - PCV13) Never done   INFLUENZA VACCINE  11/16/2019   Hepatitis C Screening  01/26/2021 (Originally [birth year])   HIV Screening  01/26/2021 (Originally 11/02/1970)   COLONOSCOPY  12/28/2026   TETANUS/TDAP  01/22/2029   COVID-19 Vaccine  Completed      Review of Systems Patient reports no vision/hearing changes, anorexia, fever ,adenopathy, persistant/recurrent hoarseness, swallowing issues, chest pain, palpitations, edema, persistant/recurrent cough, hemoptysis, dyspnea (rest,exertional, paroxysmal nocturnal), gastrointestinal  bleeding (melena, rectal bleeding), abdominal pain, excessive heart burn, GU symptoms (dysuria, hematuria, voiding/incontinence issues) syncope, focal weakness, memory loss, numbness & tingling,  skin/hair/nail changes, depression, anxiety, abnormal bruising/bleeding, musculoskeletal symptoms/signs.   This visit occurred during the SARS-CoV-2 public health emergency.  Safety protocols were in place, including screening questions prior to the visit, additional usage of staff PPE, and extensive cleaning of exam room while observing appropriate contact time as indicated for disinfecting solutions.       Objective:   Physical Exam General Appearance:    Alert, cooperative, no distress, appears stated age  Head:    Normocephalic, without obvious abnormality, atraumatic  Eyes:    PERRL, conjunctiva/corneas clear, EOM's intact, fundi    benign, both eyes       Ears:    Normal TM's and external ear canals, both ears  Nose:   Deferred due to COVID  Throat:   Neck:   Supple, symmetrical, trachea midline, no adenopathy;       thyroid:  No enlargement/tenderness/nodules  Back:     Symmetric, no curvature, ROM normal, no CVA tenderness  Lungs:     Clear to auscultation bilaterally, respirations unlabored  Chest wall:    No tenderness or deformity  Heart:    Regular rate and rhythm, S1 and S2 normal, no murmur, rub   or gallop  Abdomen:     Soft, non-tender, bowel sounds active all four quadrants,    no masses, no organomegaly  Genitalia:    Deferred  Rectal:    Extremities:   Extremities normal, atraumatic, no cyanosis or edema  Pulses:   2+ and symmetric all extremities  Skin:   Skin color, texture, turgor normal, no rashes or lesions  Lymph nodes:   Cervical, supraclavicular, and axillary nodes normal  Neurologic:   CNII-XII intact. Normal strength, sensation and reflexes  throughout          Assessment & Plan:

## 2020-01-27 NOTE — Assessment & Plan Note (Signed)
Chronic problem.  Tolerating statin w/o difficulty.  Check labs.  Adjust meds prn  

## 2020-01-27 NOTE — Assessment & Plan Note (Signed)
Pt has been alcohol free for over a year.  Applauded his efforts and encouraged him to continue

## 2020-04-02 ENCOUNTER — Other Ambulatory Visit: Payer: Self-pay | Admitting: Family Medicine

## 2020-04-02 NOTE — Telephone Encounter (Signed)
LFD 01/15/20 #15 with 1 refill LOV 01/27/20 NOV 07/29/20

## 2020-05-01 ENCOUNTER — Other Ambulatory Visit: Payer: Self-pay | Admitting: Family Medicine

## 2020-07-29 ENCOUNTER — Ambulatory Visit (INDEPENDENT_AMBULATORY_CARE_PROVIDER_SITE_OTHER): Payer: Medicare Other | Admitting: Family Medicine

## 2020-07-29 ENCOUNTER — Other Ambulatory Visit: Payer: Self-pay

## 2020-07-29 ENCOUNTER — Encounter: Payer: Self-pay | Admitting: Family Medicine

## 2020-07-29 VITALS — BP 116/70 | HR 54 | Temp 97.7°F | Resp 19 | Ht 69.0 in | Wt 160.8 lb

## 2020-07-29 DIAGNOSIS — E785 Hyperlipidemia, unspecified: Secondary | ICD-10-CM

## 2020-07-29 DIAGNOSIS — M25511 Pain in right shoulder: Secondary | ICD-10-CM | POA: Diagnosis not present

## 2020-07-29 LAB — LIPID PANEL
Cholesterol: 167 mg/dL (ref 0–200)
HDL: 73 mg/dL (ref >39.00)
LDL Cholesterol: 86 mg/dL (ref 0–99)
NonHDL: 94.43
Total CHOL/HDL Ratio: 2
Triglycerides: 42 mg/dL (ref 0.0–149.0)
VLDL: 8.4 mg/dL (ref 0.0–40.0)

## 2020-07-29 LAB — BASIC METABOLIC PANEL
BUN: 27 mg/dL — ABNORMAL HIGH (ref 6–23)
CO2: 28 mEq/L (ref 19–32)
Calcium: 9.7 mg/dL (ref 8.4–10.5)
Chloride: 105 mEq/L (ref 96–112)
Creatinine, Ser: 1.15 mg/dL (ref 0.40–1.50)
GFR: 66.68 mL/min (ref >60.00)
Glucose, Bld: 76 mg/dL (ref 70–99)
Potassium: 4.5 mEq/L (ref 3.5–5.1)
Sodium: 139 mEq/L (ref 135–145)

## 2020-07-29 LAB — HEPATIC FUNCTION PANEL
ALT: 20 U/L (ref 0–53)
AST: 24 U/L (ref 0–37)
Albumin: 4.5 g/dL (ref 3.5–5.2)
Alkaline Phosphatase: 42 U/L (ref 39–117)
Bilirubin, Direct: 0.1 mg/dL (ref 0.0–0.3)
Total Bilirubin: 0.6 mg/dL (ref 0.2–1.2)
Total Protein: 7.6 g/dL (ref 6.0–8.3)

## 2020-07-29 MED ORDER — TIZANIDINE HCL 4 MG PO TABS: 4.0000 mg | ORAL_TABLET | Freq: Three times a day (TID) | ORAL | 0 refills | Status: DC | PRN

## 2020-07-29 NOTE — Patient Instructions (Signed)
Follow up in 6 months to recheck cholesterol We'll notify you of your lab results and make any changes if needed Keep up the good work on healthy diet and regular exercise- you look great! Use the muscle relaxer prior to bed and as needed for shoulder tightness HEAT! Call with any questions or concerns Happy Easter!

## 2020-07-29 NOTE — Progress Notes (Signed)
   Subjective:    Patient ID: Martin Malone, male    DOB: [birth year], 65 y.o.   MRN: [id number]  HPI Hyperlipidemia- chronic problem, on Lipitor 10mg  nightly.  Exercising regularly.  Denies abd pain, N/V, CP, SOB.  R shoulder pain- sxs started 4-5 days ago.  Some relief w/ tylenol/ibuprofen.  No known injury.  No bruising or swelling.  Pain is worse when lying down or in the recliner.  Full ROM of neck and shoulder.     Review of Systems For ROS see HPI   This visit occurred during the SARS-CoV-2 public health emergency.  Safety protocols were in place, including screening questions prior to the visit, additional usage of staff PPE, and extensive cleaning of exam room while observing appropriate contact time as indicated for disinfecting solutions.       Objective:   Physical Exam Vitals reviewed.  Constitutional:      General: He is not in acute distress.    Appearance: Normal appearance. He is well-developed. He is not ill-appearing.  HENT:     Head: Normocephalic and atraumatic.  Eyes:     Conjunctiva/sclera: Conjunctivae normal.     Pupils: Pupils are equal, round, and reactive to light.  Neck:     Thyroid: No thyromegaly.  Cardiovascular:     Rate and Rhythm: Normal rate and regular rhythm.     Heart sounds: Normal heart sounds. No murmur heard.   Pulmonary:     Effort: Pulmonary effort is normal. No respiratory distress.     Breath sounds: Normal breath sounds.  Abdominal:     General: Bowel sounds are normal. There is no distension.     Palpations: Abdomen is soft.  Musculoskeletal:        General: Tenderness (TTP over R trap) present. No swelling. Normal range of motion.     Cervical back: Normal range of motion and neck supple.     Right lower leg: No edema.     Left lower leg: No edema.  Lymphadenopathy:     Cervical: No cervical adenopathy.  Skin:    General: Skin is warm and dry.  Neurological:     Mental Status: He is alert and oriented to person,  place, and time.     Cranial Nerves: No cranial nerve deficit.  Psychiatric:        Behavior: Behavior normal.           Assessment & Plan:  R shoulder pain- new.  sxs started 4-5 days ago.  No bruising, swelling, known injury.  Full ROM.  Most painful when lying down.  TTP over R trap w/ palpable muscle tightness.  Start Zanaflex prn.  Reviewed supportive care and red flags that should prompt return.  Pt expressed understanding and is in agreement w/ plan.

## 2020-07-29 NOTE — Assessment & Plan Note (Signed)
Chronic problem.  Tolerating Lipitor w/o difficulty.  Applauded his efforts at diet and exercise.  Check labs.  Adjust meds prn

## 2020-08-30 ENCOUNTER — Other Ambulatory Visit: Payer: Self-pay

## 2020-08-30 DIAGNOSIS — M25511 Pain in right shoulder: Secondary | ICD-10-CM

## 2020-08-30 MED ORDER — TIZANIDINE HCL 4 MG PO TABS: 4.0000 mg | ORAL_TABLET | Freq: Three times a day (TID) | ORAL | 0 refills | Status: DC | PRN

## 2020-10-08 ENCOUNTER — Telehealth: Payer: Self-pay

## 2020-10-08 MED ORDER — ZOLPIDEM TARTRATE 10 MG PO TABS: ORAL_TABLET | ORAL | 3 refills | Status: DC

## 2020-10-08 NOTE — Telephone Encounter (Signed)
Requesting:Ambien 10mg  Contract: UDS: Last Visit:07/29/20 Next Visit:01/24/21 Last Refill:04/02/20 15 tabs 3 refills Please Advise.

## 2020-10-08 NOTE — Telephone Encounter (Signed)
Prescription filled at pt request °

## 2020-10-13 ENCOUNTER — Encounter: Payer: Self-pay | Admitting: *Deleted

## 2020-11-12 ENCOUNTER — Other Ambulatory Visit: Payer: Self-pay

## 2020-11-12 DIAGNOSIS — M25511 Pain in right shoulder: Secondary | ICD-10-CM

## 2020-11-12 DIAGNOSIS — E785 Hyperlipidemia, unspecified: Secondary | ICD-10-CM

## 2020-11-12 MED ORDER — ATORVASTATIN CALCIUM 10 MG PO TABS: ORAL_TABLET | ORAL | 1 refills | Status: DC

## 2020-11-12 MED ORDER — TIZANIDINE HCL 4 MG PO TABS: 4.0000 mg | ORAL_TABLET | Freq: Three times a day (TID) | ORAL | 0 refills | Status: DC | PRN

## 2021-01-24 ENCOUNTER — Other Ambulatory Visit: Payer: Self-pay

## 2021-01-24 ENCOUNTER — Encounter: Payer: Self-pay | Admitting: Family Medicine

## 2021-01-24 ENCOUNTER — Ambulatory Visit (INDEPENDENT_AMBULATORY_CARE_PROVIDER_SITE_OTHER): Payer: Medicare Other | Admitting: Family Medicine

## 2021-01-24 VITALS — BP 115/80 | HR 82 | Temp 97.3°F | Resp 17 | Ht 69.0 in | Wt 165.2 lb

## 2021-01-24 DIAGNOSIS — Z125 Encounter for screening for malignant neoplasm of prostate: Secondary | ICD-10-CM

## 2021-01-24 DIAGNOSIS — M25511 Pain in right shoulder: Secondary | ICD-10-CM

## 2021-01-24 DIAGNOSIS — Z23 Encounter for immunization: Secondary | ICD-10-CM | POA: Diagnosis not present

## 2021-01-24 DIAGNOSIS — E785 Hyperlipidemia, unspecified: Secondary | ICD-10-CM

## 2021-01-24 LAB — BASIC METABOLIC PANEL
BUN: 18 mg/dL (ref 6–23)
CO2: 29 mEq/L (ref 19–32)
Calcium: 9.3 mg/dL (ref 8.4–10.5)
Chloride: 104 mEq/L (ref 96–112)
Creatinine, Ser: 1.17 mg/dL (ref 0.40–1.50)
GFR: 65.09 mL/min (ref >60.00)
Glucose, Bld: 87 mg/dL (ref 70–99)
Potassium: 4.7 mEq/L (ref 3.5–5.1)
Sodium: 140 mEq/L (ref 135–145)

## 2021-01-24 LAB — CBC WITH DIFFERENTIAL/PLATELET
Basophils Absolute: 0.1 10*3/uL (ref 0.0–0.1)
Basophils Relative: 0.8 % (ref 0.0–3.0)
Eosinophils Absolute: 0.2 10*3/uL (ref 0.0–0.7)
Eosinophils Relative: 3.6 % (ref 0.0–5.0)
HCT: 40.2 % (ref 39.0–52.0)
Hemoglobin: 13.2 g/dL (ref 13.0–17.0)
Lymphocytes Relative: 26.1 % (ref 12.0–46.0)
Lymphs Abs: 1.8 10*3/uL (ref 0.7–4.0)
MCHC: 32.8 g/dL (ref 30.0–36.0)
MCV: 95.1 fl (ref 78.0–100.0)
Monocytes Absolute: 0.5 10*3/uL (ref 0.1–1.0)
Monocytes Relative: 6.7 % (ref 3.0–12.0)
Neutro Abs: 4.3 10*3/uL (ref 1.4–7.7)
Neutrophils Relative %: 62.8 % (ref 43.0–77.0)
Platelets: 290 10*3/uL (ref 150.0–400.0)
RBC: 4.22 Mil/uL (ref 4.22–5.81)
RDW: 14.4 % (ref 11.5–15.5)
WBC: 6.8 10*3/uL (ref 4.0–10.5)

## 2021-01-24 LAB — HEPATIC FUNCTION PANEL
ALT: 16 U/L (ref 0–53)
AST: 22 U/L (ref 0–37)
Albumin: 4.5 g/dL (ref 3.5–5.2)
Alkaline Phosphatase: 44 U/L (ref 39–117)
Bilirubin, Direct: 0.1 mg/dL (ref 0.0–0.3)
Total Bilirubin: 0.4 mg/dL (ref 0.2–1.2)
Total Protein: 6.9 g/dL (ref 6.0–8.3)

## 2021-01-24 LAB — TSH: TSH: 1.87 u[IU]/mL (ref 0.35–5.50)

## 2021-01-24 LAB — LIPID PANEL
Cholesterol: 178 mg/dL (ref 0–200)
HDL: 73.7 mg/dL (ref >39.00)
LDL Cholesterol: 94 mg/dL (ref 0–99)
NonHDL: 104.54
Total CHOL/HDL Ratio: 2
Triglycerides: 52 mg/dL (ref 0.0–149.0)
VLDL: 10.4 mg/dL (ref 0.0–40.0)

## 2021-01-24 LAB — PSA, MEDICARE: PSA: 4.99 ng/ml — ABNORMAL HIGH (ref 0.10–4.00)

## 2021-01-24 MED ORDER — METHOCARBAMOL 500 MG PO TABS: 500.0000 mg | ORAL_TABLET | Freq: Three times a day (TID) | ORAL | 1 refills | Status: DC | PRN

## 2021-01-24 MED ORDER — TIZANIDINE HCL 4 MG PO TABS: 4.0000 mg | ORAL_TABLET | Freq: Three times a day (TID) | ORAL | 0 refills | Status: DC | PRN

## 2021-01-24 NOTE — Progress Notes (Signed)
   Subjective:    Patient ID: Martin Malone, male    DOB: 14-Apr-1956, 65 y.o.   MRN: [id number]  HPI Hyperlipidemia- chronic problem, on Lipitor 10mg  daily.  Denies CP, SOB, abd pain, N/V.  Continues to exercise regularly.  R shoulder/neck pain- sxs started a few months ago.  No heavy lifting.  No pain when golfing.  'it's just sore'- worse when watching TV or lying down.  Has tried ice and heat w/ little improvement.   Review of Systems For ROS see HPI   This visit occurred during the SARS-CoV-2 public health emergency.  Safety protocols were in place, including screening questions prior to the visit, additional usage of staff PPE, and extensive cleaning of exam room while observing appropriate contact time as indicated for disinfecting solutions.      Objective:   Physical Exam Vitals reviewed.  Constitutional:      General: He is not in acute distress.    Appearance: Normal appearance. He is well-developed. He is not ill-appearing.  HENT:     Head: Normocephalic and atraumatic.  Eyes:     Extraocular Movements: Extraocular movements intact.     Conjunctiva/sclera: Conjunctivae normal.     Pupils: Pupils are equal, round, and reactive to light.  Neck:     Thyroid: No thyromegaly.  Cardiovascular:     Rate and Rhythm: Normal rate and regular rhythm.     Pulses: Normal pulses.     Heart sounds: Normal heart sounds. No murmur heard. Pulmonary:     Effort: Pulmonary effort is normal. No respiratory distress.     Breath sounds: Normal breath sounds.  Abdominal:     General: Bowel sounds are normal. There is no distension.     Palpations: Abdomen is soft.  Musculoskeletal:     Cervical back: Normal range of motion and neck supple.     Right lower leg: No edema.     Left lower leg: No edema.  Lymphadenopathy:     Cervical: No cervical adenopathy.  Skin:    General: Skin is warm and dry.  Neurological:     General: No focal deficit present.     Mental Status: He is alert  and oriented to person, place, and time.     Cranial Nerves: No cranial nerve deficit.  Psychiatric:        Mood and Affect: Mood normal.        Behavior: Behavior normal.          Assessment & Plan:   R neck/shoulder pain- new.  Sxs started a few months ago.  No known injury.  No recent heavy lifting or change in activity level.  He recently got Robaxin from [facility] and thought this helped more than the Tizanidine.  Refill provided.  Offered referral for complete evaluation but pt prefers to hold at this time.

## 2021-01-24 NOTE — Assessment & Plan Note (Signed)
Chronic problem.  Tolerating Lipitor w/o difficulty.  Check labs.  Adjust meds prn  

## 2021-01-24 NOTE — Patient Instructions (Signed)
Follow up in 6 months to recheck cholesterol We'll notify you of your lab results and make any changes if needed Keep up the good work!  You look great! Use the Methocarbamol as needed for neck/shoulder pain Get the COVID booster at your convenience- as long as it's been 2 months since the last booster Call with any questions or concerns ENJOY [place]!!!

## 2021-01-25 ENCOUNTER — Other Ambulatory Visit: Payer: Self-pay

## 2021-01-25 DIAGNOSIS — R972 Elevated prostate specific antigen [PSA]: Secondary | ICD-10-CM

## 2021-03-01 DIAGNOSIS — M25551 Pain in right hip: Secondary | ICD-10-CM | POA: Insufficient documentation

## 2021-04-22 ENCOUNTER — Other Ambulatory Visit: Payer: Self-pay | Admitting: Family Medicine

## 2021-04-25 ENCOUNTER — Other Ambulatory Visit: Payer: Self-pay

## 2021-04-25 ENCOUNTER — Other Ambulatory Visit: Payer: Self-pay | Admitting: Family Medicine

## 2021-04-25 MED ORDER — ZOLPIDEM TARTRATE 10 MG PO TABS: ORAL_TABLET | ORAL | 3 refills | Status: DC

## 2021-05-18 ENCOUNTER — Telehealth: Payer: Self-pay | Admitting: Family Medicine

## 2021-05-18 ENCOUNTER — Other Ambulatory Visit: Payer: Self-pay

## 2021-05-18 DIAGNOSIS — E785 Hyperlipidemia, unspecified: Secondary | ICD-10-CM

## 2021-05-18 MED ORDER — ATORVASTATIN CALCIUM 10 MG PO TABS: ORAL_TABLET | ORAL | 1 refills | Status: DC

## 2021-05-18 NOTE — Telephone Encounter (Signed)
Med sent to pharmacy.

## 2021-05-18 NOTE — Telephone Encounter (Signed)
Pt needs a refill on atorvastatin pt uses [facility] on [address]   Last appt was oct 2022

## 2021-07-28 ENCOUNTER — Ambulatory Visit: Payer: Medicare Other | Admitting: Family Medicine

## 2021-08-29 ENCOUNTER — Telehealth: Payer: Self-pay | Admitting: Family Medicine

## 2021-08-29 NOTE — Telephone Encounter (Signed)
Left message for patient to call back and schedule Medicare Annual Wellness Visit (AWV) .  ? ?Please offer to do virtually or by telephone.  Left office number and my jabber [phone]. ? ?AWVI eligible as of 01/15/2021 ? ?Please schedule at anytime with Nurse Health Advisor. ?  ?

## 2021-09-01 ENCOUNTER — Ambulatory Visit (INDEPENDENT_AMBULATORY_CARE_PROVIDER_SITE_OTHER): Payer: Medicare Other

## 2021-09-01 VITALS — Wt 165.0 lb

## 2021-09-01 DIAGNOSIS — Z0000 Encounter for general adult medical examination without abnormal findings: Secondary | ICD-10-CM | POA: Diagnosis not present

## 2021-09-01 DIAGNOSIS — Z85828 Personal history of other malignant neoplasm of skin: Secondary | ICD-10-CM | POA: Insufficient documentation

## 2021-09-01 DIAGNOSIS — D225 Melanocytic nevi of trunk: Secondary | ICD-10-CM | POA: Insufficient documentation

## 2021-09-01 DIAGNOSIS — L821 Other seborrheic keratosis: Secondary | ICD-10-CM | POA: Insufficient documentation

## 2021-09-01 NOTE — Progress Notes (Signed)
Subjective:   Martin Malone is a 66 y.o. male who presents for Medicare Annual/Subsequent preventive examination.  Virtual Visit via Telephone Note  I connected with  Martin Malone on 09/01/21 at  3:45 PM EDT by telephone and verified that I am speaking with the correct person using two identifiers.  Location: Patient: Home Provider: Fernande Malone Persons participating in the virtual visit: patient/Nurse Health Advisor   I discussed the limitations, risks, security and privacy concerns of performing an evaluation and management service by telephone and the availability of in person appointments. The patient expressed understanding and agreed to proceed.  Interactive audio and video telecommunications were attempted between this nurse and patient, however failed, due to patient having technical difficulties OR patient did not have access to video capability.  We continued and completed visit with audio only.  Some vital signs may be absent or patient reported.   Martin Malone Martin Ladene Allocca, LPN   Review of Systems     Cardiac Risk Factors include: advanced age (>7mn, >>53women);dyslipidemia;male gender;family history of premature cardiovascular disease     Objective:    Today's Vitals   09/01/21 1538  Weight: 165 lb (74.8 kg)   Body mass index is 24.37 kg/m.     09/01/2021    4:55 PM 01/27/2020    8:57 AM  Advanced Directives  Does Patient Have a Medical Advance Directive? Yes Yes  Type of Advance Directive Living will;Healthcare Power of [place]  Does patient want to make changes to medical advance directive?  Yes (MAU/Ambulatory/Procedural Areas - Information given)  Copy of HVine [place] Chart? No - copy requested No - copy requested    Current Medications (verified) Outpatient Encounter Medications as of 09/01/2021  Medication Sig   aspirin 81 MG tablet Take 81 mg by mouth daily.   atorvastatin (LIPITOR)  10 MG tablet TAKE ONE TABLET BY MOUTH EVERY NIGHT AT BEDTIME   glucosamine-chondroitin 500-400 MG tablet Take 1 tablet by mouth daily.   KRILL OIL PO Take 2 tablets by mouth daily.   methocarbamol (ROBAXIN) 500 MG tablet Take 1 tablet (500 mg total) by mouth every 8 (eight) hours as needed for muscle spasms.   Multiple Vitamin (MULTIVITAMIN) tablet Take 1 tablet by mouth daily.   tiZANidine (ZANAFLEX) 2 MG tablet Take 2 mg by mouth 2 (two) times daily.   zolpidem (AMBIEN) 10 MG tablet TAKE ONE TABLET BY MOUTH EVERY NIGHT AT BEDTIME AS NEEDED FOR SLEEP   [DISCONTINUED] Omega-3 Fatty Acids (FISH OIL) 500 MG CAPS 1 capsule   No facility-administered encounter medications on file as of 09/01/2021.    Allergies (verified) Patient has no known allergies.   History: Past Medical History:  Diagnosis Date   GERD (gastroesophageal reflux disease)    Hyperlipidemia    Rib fracture    Past Surgical History:  Procedure Laterality Date   TOTAL KNEE ARTHROPLASTY  2006   Family History  Problem Relation Age of Onset   Coronary artery disease Brother    Dementia Mother    Pancreatitis Father    Social History   Socioeconomic History   Marital status: Married    Spouse name: Martin Malone  Number of children: 1   Years of education: Not on file   Highest education level: Not on file  Occupational History   Occupation: fTeacher, adult education Tobacco Use   Smoking status: Former    Types: Cigarettes    Quit date:  04/17/1977    Years since quitting: 44.4   Smokeless tobacco: Never  Vaping Use   Vaping Use: Never used  Substance and Sexual Activity   Alcohol use: Not Currently    Alcohol/week: 2.0 standard drinks    Types: 2 Glasses of wine per week   Drug use: Never   Sexual activity: Yes  Other Topics Concern   Not on file  Social History Narrative   Not on file   Social Determinants of Health   Financial Resource Strain: Low Risk    Difficulty of Paying Living Expenses: Not hard at all   Food Insecurity: No Food Insecurity   Worried About Charity fundraiser in the Last Year: Never true   Ran Out of Food in the Last Year: Never true  Transportation Needs: No Transportation Needs   Lack of Transportation (Medical): No   Lack of Transportation (Non-Medical): No  Physical Activity: Sufficiently Active   Days of Exercise per Week: 7 days   Minutes of Exercise per Session: 60 min  Stress: No Stress Concern Present   Feeling of Stress : Not at all  Social Connections: Moderately Integrated   Frequency of Communication with Friends and Family: More than three times a week   Frequency of Social Gatherings with Friends and Family: More than three times a week   Attends Religious Services: Never   Marine scientist or Organizations: Yes   Attends Music therapist: More than 4 times per year   Marital Status: Married    Tobacco Counseling Counseling given: Not Answered   Clinical Intake:  Pre-visit preparation completed: Yes  Pain : No/denies pain     BMI - recorded: 24.37 Nutritional Status: BMI of 19-24  Normal Nutritional Risks: None Diabetes: No  How often do you need to have someone help you when you read instructions, pamphlets, or other written materials from your doctor or pharmacy?: 1 - Never  Diabetic? no  Interpreter Needed?: No  Information entered by :: Martin Kienitz, LPN   Activities of Daily Living    09/01/2021    4:56 PM 01/24/2021   10:30 AM  In your present state of health, do you have any difficulty performing the following activities:  Hearing? 0 0  Vision? 0 0  Difficulty concentrating or making decisions? 0 0  Walking or climbing stairs? 0 0  Dressing or bathing? 0 0  Doing errands, shopping? 0 0  Preparing Food and eating ? N   Using the Toilet? N   In the past six months, have you accidently leaked urine? N   Do you have problems with loss of bowel control? N   Managing your Medications? N   Managing your  Finances? N   Housekeeping or managing your Housekeeping? N     Patient Care Team: Martin Minium, MD as PCP - General (Family Medicine) Martin Mocha, MD as Consulting Physician (Cardiology)  Indicate any recent Medical Services you may have received from other than [facility] providers in the past year (date may be approximate).     Assessment:   This is a routine wellness examination for [place].  Hearing/Vision screen Hearing Screening - Comments:: Denies hearing difficulties   Vision Screening - Comments:: Wears rx glasses - up to date with routine eye exams with [facility] - looking for new optometrist as they no longer accept [facility]  Dietary issues and exercise activities discussed: Current Exercise Habits: Home exercise routine, Type of exercise: walking;strength training/weights;stretching;Other -  see comments (jogging, golfing), Time (Minutes): 60, Frequency (Times/Week): 7, Weekly Exercise (Minutes/Week): 420, Intensity: Moderate, Exercise limited by: None identified   Goals Addressed             This Visit's Progress    Patient Stated       Stay happy, healthy, active, independent       Depression Screen    09/01/2021    4:54 PM 01/24/2021   10:30 AM 07/29/2020    8:40 AM 01/27/2020    8:55 AM 07/23/2019    8:07 AM 01/23/2019    8:06 AM 01/02/2019   11:30 AM  PHQ 2/9 Scores  PHQ - 2 Score 0 0 0 0 0 0 0  PHQ- 9 Score  0 0 0 0 0 0    Fall Risk    09/01/2021    3:40 PM 01/24/2021   10:29 AM 07/29/2020    8:40 AM 01/27/2020    8:58 AM 01/27/2020    8:55 AM  Fall Risk   Falls in the past year? 0 0 0 0 0  Number falls in past yr: 0 0 0 0 0  Injury with Fall? 0 0 0 0 0  Risk for fall due to : No Fall Risks No Fall Risks No Fall Risks    Follow up Falls prevention discussed   Falls evaluation completed Falls evaluation completed    FALL RISK PREVENTION PERTAINING TO THE HOME:  Any stairs in or around the home? Yes  If so, are there any without handrails? No   Home free of loose throw rugs in walkways, pet beds, electrical cords, etc? Yes  Adequate lighting in your home to reduce risk of falls? Yes   ASSISTIVE DEVICES UTILIZED TO PREVENT FALLS:  Life alert? No  Use of a cane, walker or w/c? No  Grab bars in the bathroom? No  Shower chair or bench in shower? No  Elevated toilet seat or a handicapped toilet? No   TIMED UP AND GO:  Was the test performed? No . Telephonic visit  Cognitive Function:        09/01/2021    4:57 PM  6CIT Screen  What Year? 0 points  What month? 0 points  What time? 0 points  Count back from 20 0 points  Months in reverse 0 points  Repeat phrase 0 points  Total Score 0 points    Immunizations Immunization History  Administered Date(s) Administered   Fluad Quad(high Dose 65+) 01/27/2020, 01/24/2021   Influenza, Seasonal, Injecte, Preservative Fre 01/17/2015   Influenza,inj,Quad PF,6+ Mos 01/15/2013, 01/15/2014, 12/22/2015, 12/28/2016, 01/21/2018, 01/23/2019   PFIZER(Purple Top)SARS-COV-2 Vaccination 07/11/2019, 08/01/2019, 02/11/2020, 12/09/2020, 02/09/2021   Pneumococcal Conjugate-13 01/27/2020   Tdap 04/17/2008, 01/23/2019   Zoster, Live 05/28/2013    TDAP status: Up to date  Flu Vaccine status: Up to date  Pneumococcal vaccine status: Due, Education has been provided regarding the importance of this vaccine. Advised may receive this vaccine at local pharmacy or Health Dept. Aware to provide a copy of the vaccination record if obtained from local pharmacy or Health Dept. Verbalized acceptance and understanding.  Covid-19 vaccine status: Completed vaccines  Qualifies for Shingles Vaccine? Yes   Zostavax completed Yes   Shingrix Completed?: No.    Education has been provided regarding the importance of this vaccine. Patient has been advised to call insurance company to determine out of pocket expense if they have not yet received this vaccine. Advised may also receive vaccine at local pharmacy  or  Health Dept. Verbalized acceptance and understanding.  Screening Tests Health Maintenance  Topic Date Due   Hepatitis C Screening  Never done   Zoster Vaccines- Shingrix (1 of 2) Never done   Pneumonia Vaccine 32+ Years old (2 - PPSV23 if available, else PCV20) 01/26/2021   COVID-19 Vaccine (6 - Booster for Pfizer series) 04/06/2021   INFLUENZA VACCINE  11/15/2021   COLONOSCOPY (Pts 45-42yr Insurance coverage will need to be confirmed)  12/27/2021   TETANUS/TDAP  01/22/2029   HPV VACCINES  Aged Out    Health Maintenance  Health Maintenance Due  Topic Date Due   Hepatitis C Screening  Never done   Zoster Vaccines- Shingrix (1 of 2) Never done   Pneumonia Vaccine 67 Years old (2 - PPSV23 if available, else PCV20) 01/26/2021   COVID-19 Vaccine (6 - Booster for Pfizer series) 04/06/2021    Colorectal cancer screening: Type of screening: Colonoscopy. Completed 12/27/2016. Repeat every 5 years  Lung Cancer Screening: (Low Dose CT Chest recommended if Age 67-80years, 30 pack-year currently smoking OR have quit w/in 15years.) does not qualify.    Additional Screening:  Hepatitis C Screening: does qualify; DUE  Vision Screening: Recommended annual ophthalmology exams for early detection of glaucoma and other disorders of the eye. Is the patient up to date with their annual eye exam?  Yes  Who is the provider or what is the name of the office in which the patient attends annual eye exams? MS[facility] HeckIf pt is not established with a provider, would they like to be referred to a provider to establish care? No .   Dental Screening: Recommended annual dental exams for proper oral hygiene  Community Resource Referral / Chronic Care Management: CRR required this visit?  No   CCM required this visit?  No      Plan:     I have personally reviewed and noted the following in the patient's chart:   Medical and social history Use of alcohol, tobacco or illicit drugs  Current medications  and supplements including opioid prescriptions. Patient is not currently taking opioid prescriptions. Functional ability and status Nutritional status Physical activity Advanced directives List of other physicians Hospitalizations, surgeries, and ER visits in previous 12 months Vitals Screenings to include cognitive, depression, and falls Referrals and appointments  In addition, I have reviewed and discussed with patient certain preventive protocols, quality metrics, and best practice recommendations. A written personalized care plan for preventive services as well as general preventive health recommendations were provided to patient.     ASandrea Hammond LPN   [id number]  Nurse Notes: None

## 2021-09-01 NOTE — Patient Instructions (Signed)
[name] , Thank you for taking time to come for your Medicare Wellness Visit. I appreciate your ongoing commitment to your health goals. Please review the following plan we discussed and let me know if I can assist you in the future.   Screening recommendations/referrals: Colonoscopy: Done 12/27/2016 - Repeat in 5 years *This is due this September, 2023 Recommended yearly ophthalmology/optometry visit for glaucoma screening and checkup Recommended yearly dental visit for hygiene and checkup  Vaccinations: Influenza vaccine: Done 01/24/2021 - Repeat annually  Pneumococcal vaccine: [id number]-59 Done 01/27/2020 - due for Pneumovax-23 Tdap vaccine: done 01/23/2019 - a.h10 Shingles vaccine: zostavax done 2015 - Shingrix is due which is 2 doses 2-6 months apart and over 90% effective     Covid-19: Done 07/11/2019, 08/01/2019, 02/11/2020, 12/09/2020, & 02/09/2021  Advanced directives: Please bring a copy of your health care power of attorney and living will to the office to be added to your chart at your convenience.   Conditions/risks identified: Aim for 30 minutes of exercise or brisk walking, 6-8 glasses of water, and 5 servings of fruits and vegetables each day.   Next appointment: Follow up in one year for your annual wellness visit.   Preventive Care 67 Years and Older, Male  Preventive care refers to lifestyle choices and visits with your health care provider that can promote health and wellness. What does preventive care include? A yearly physical exam. This is also called an annual well check. Dental exams once or twice a year. Routine eye exams. Ask your health care provider how often you should have your eyes checked. Personal lifestyle choices, including: Daily care of your teeth and gums. Regular physical activity. Eating a healthy diet. Avoiding tobacco and drug use. Limiting alcohol use. Practicing safe sex. Taking low doses of aspirin every day. Taking vitamin and mineral  supplements as recommended by your health care provider. What happens during an annual well check? The services and screenings done by your health care provider during your annual well check will depend on your age, overall health, lifestyle risk factors, and family history of disease. Counseling  Your health care provider may ask you questions about your: Alcohol use. Tobacco use. Drug use. Emotional well-being. Home and relationship well-being. Sexual activity. Eating habits. History of falls. Memory and ability to understand (cognition). Work and work Statistician. Screening  You may have the following tests or measurements: Height, weight, and BMI. Blood pressure. Lipid and cholesterol levels. These may be checked every 5 years, or more frequently if you are over 33 years old. Skin check. Lung cancer screening. You may have this screening every year starting at age 67 if you have a 30-pack-year history of smoking and currently smoke or have quit within the past 15 years. Fecal occult blood test (FOBT) of the stool. You may have this test every year starting at age 67. Flexible sigmoidoscopy or colonoscopy. You may have a sigmoidoscopy every 5 years or a colonoscopy every 10 years starting at age 73. Prostate cancer screening. Recommendations will vary depending on your family history and other risks. Hepatitis C blood test. Hepatitis B blood test. Sexually transmitted disease (STD) testing. Diabetes screening. This is done by checking your blood sugar (glucose) after you have not eaten for a while (fasting). You may have this done every 1-3 years. Abdominal aortic aneurysm (AAA) screening. You may need this if you are a current or former smoker. Osteoporosis. You may be screened starting at age 40 if you are at high risk.  Talk with your health care provider about your test results, treatment options, and if necessary, the need for more tests. Vaccines  Your health care provider  may recommend certain vaccines, such as: Influenza vaccine. This is recommended every year. Tetanus, diphtheria, and acellular pertussis (Tdap, Td) vaccine. You may need a Td booster every 10 years. Zoster vaccine. You may need this after age 67. Pneumococcal 13-valent conjugate (PCV13) vaccine. One dose is recommended after age 67. Pneumococcal polysaccharide (PPSV23) vaccine. One dose is recommended after age 67. Talk to your health care provider about which screenings and vaccines you need and how often you need them. This information is not intended to replace advice given to you by your health care provider. Make sure you discuss any questions you have with your health care provider. Document Released: 04/30/2015 Document Revised: 12/22/2015 Document Reviewed: 02/02/2015 Elsevier Interactive Patient Education  2017 [employer] Prevention in the Home Falls can cause injuries. They can happen to people of all ages. There are many things you can do to make your home safe and to help prevent falls. What can I do on the outside of my home? Regularly fix the edges of walkways and driveways and fix any cracks. Remove anything that might make you trip as you walk through a door, such as a raised step or threshold. Trim any bushes or trees on the path to your home. Use bright outdoor lighting. Clear any walking paths of anything that might make someone trip, such as rocks or tools. Regularly check to see if handrails are loose or broken. Make sure that both sides of any steps have handrails. Any raised decks and porches should have guardrails on the edges. Have any leaves, snow, or ice cleared regularly. Use sand or salt on walking paths during winter. Clean up any spills in your garage right away. This includes oil or grease spills. What can I do in the bathroom? Use night lights. Install grab bars by the toilet and in the tub and shower. Do not use towel bars as grab bars. Use  non-skid mats or decals in the tub or shower. If you need to sit down in the shower, use a plastic, non-slip stool. Keep the floor dry. Clean up any water that spills on the floor as soon as it happens. Remove soap buildup in the tub or shower regularly. Attach bath mats securely with double-sided non-slip rug tape. Do not have throw rugs and other things on the floor that can make you trip. What can I do in the bedroom? Use night lights. Make sure that you have a light by your bed that is easy to reach. Do not use any sheets or blankets that are too big for your bed. They should not hang down onto the floor. Have a firm chair that has side arms. You can use this for support while you get dressed. Do not have throw rugs and other things on the floor that can make you trip. What can I do in the kitchen? Clean up any spills right away. Avoid walking on wet floors. Keep items that you use a lot in easy-to-reach places. If you need to reach something above you, use a strong step stool that has a grab bar. Keep electrical cords out of the way. Do not use floor polish or wax that makes floors slippery. If you must use wax, use non-skid floor wax. Do not have throw rugs and other things on the floor that can make  you trip. What can I do with my stairs? Do not leave any items on the stairs. Make sure that there are handrails on both sides of the stairs and use them. Fix handrails that are broken or loose. Make sure that handrails are as long as the stairways. Check any carpeting to make sure that it is firmly attached to the stairs. Fix any carpet that is loose or worn. Avoid having throw rugs at the top or bottom of the stairs. If you do have throw rugs, attach them to the floor with carpet tape. Make sure that you have a light switch at the top of the stairs and the bottom of the stairs. If you do not have them, ask someone to add them for you. What else can I do to help prevent falls? Wear  shoes that: Do not have high heels. Have rubber bottoms. Are comfortable and fit you well. Are closed at the toe. Do not wear sandals. If you use a stepladder: Make sure that it is fully opened. Do not climb a closed stepladder. Make sure that both sides of the stepladder are locked into place. Ask someone to hold it for you, if possible. Clearly mark and make sure that you can see: Any grab bars or handrails. First and last steps. Where the edge of each step is. Use tools that help you move around (mobility aids) if they are needed. These include: Canes. Walkers. Scooters. Crutches. Turn on the lights when you go into a dark area. Replace any light bulbs as soon as they burn out. Set up your furniture so you have a clear path. Avoid moving your furniture around. If any of your floors are uneven, fix them. If there are any pets around you, be aware of where they are. Review your medicines with your doctor. Some medicines can make you feel dizzy. This can increase your chance of falling. Ask your doctor what other things that you can do to help prevent falls. This information is not intended to replace advice given to you by your health care provider. Make sure you discuss any questions you have with your health care provider. Document Released: 01/28/2009 Document Revised: 09/09/2015 Document Reviewed: 05/08/2014 Elsevier Interactive Patient Education  2017 [employer].

## 2021-09-05 ENCOUNTER — Other Ambulatory Visit: Payer: Self-pay

## 2021-09-05 DIAGNOSIS — D225 Melanocytic nevi of trunk: Secondary | ICD-10-CM

## 2021-09-05 DIAGNOSIS — M722 Plantar fascial fibromatosis: Secondary | ICD-10-CM

## 2021-09-05 MED ORDER — TIZANIDINE HCL 2 MG PO TABS: 2.0000 mg | ORAL_TABLET | Freq: Two times a day (BID) | ORAL | 1 refills | Status: DC

## 2021-09-16 ENCOUNTER — Other Ambulatory Visit: Payer: Self-pay | Admitting: Family Medicine

## 2021-09-16 MED ORDER — ZOLPIDEM TARTRATE 10 MG PO TABS: ORAL_TABLET | ORAL | 3 refills | Status: DC

## 2021-09-16 NOTE — Telephone Encounter (Signed)
Patient is requesting a refill of the following medications: Requested Prescriptions   Pending Prescriptions Disp Refills   zolpidem (AMBIEN) 10 MG tablet 15 tablet 3    Sig: TAKE ONE TABLET BY MOUTH EVERY NIGHT AT BEDTIME AS NEEDED FOR SLEEP   Date of patient request: 09/16/21 Last office visit: 01/24/21 Date of last refill: 04/25/21 Last refill amount: 15

## 2021-09-16 NOTE — Telephone Encounter (Signed)
PT stating that his pharmacy faxed over a refill for zolpidem 10 mg. Pharmacy has not gotten a response about refill.

## 2021-10-05 ENCOUNTER — Encounter: Payer: Self-pay | Admitting: Family Medicine

## 2021-10-05 ENCOUNTER — Ambulatory Visit (INDEPENDENT_AMBULATORY_CARE_PROVIDER_SITE_OTHER): Payer: Medicare Other | Admitting: Family Medicine

## 2021-10-05 VITALS — BP 128/76 | HR 91 | Temp 98.4°F | Resp 16 | Ht 69.0 in | Wt 170.4 lb

## 2021-10-05 DIAGNOSIS — E785 Hyperlipidemia, unspecified: Secondary | ICD-10-CM | POA: Diagnosis not present

## 2021-10-05 DIAGNOSIS — G4700 Insomnia, unspecified: Secondary | ICD-10-CM | POA: Insufficient documentation

## 2021-10-05 DIAGNOSIS — Z23 Encounter for immunization: Secondary | ICD-10-CM

## 2021-10-05 LAB — LIPID PANEL
Cholesterol: 204 mg/dL — ABNORMAL HIGH (ref 0–200)
HDL: 75.1 mg/dL (ref >39.00)
LDL Cholesterol: 111 mg/dL — ABNORMAL HIGH (ref 0–99)
NonHDL: 128.75
Total CHOL/HDL Ratio: 3
Triglycerides: 88 mg/dL (ref 0.0–149.0)
VLDL: 17.6 mg/dL (ref 0.0–40.0)

## 2021-10-05 LAB — BASIC METABOLIC PANEL
BUN: 17 mg/dL (ref 6–23)
CO2: 25 mEq/L (ref 19–32)
Calcium: 9.6 mg/dL (ref 8.4–10.5)
Chloride: 103 mEq/L (ref 96–112)
Creatinine, Ser: 1.2 mg/dL (ref 0.40–1.50)
GFR: 62.83 mL/min (ref >60.00)
Glucose, Bld: 89 mg/dL (ref 70–99)
Potassium: 4.8 mEq/L (ref 3.5–5.1)
Sodium: 138 mEq/L (ref 135–145)

## 2021-10-05 LAB — TSH: TSH: 2.01 u[IU]/mL (ref 0.35–5.50)

## 2021-10-05 LAB — HEPATIC FUNCTION PANEL
ALT: 24 U/L (ref 0–53)
AST: 27 U/L (ref 0–37)
Albumin: 4.5 g/dL (ref 3.5–5.2)
Alkaline Phosphatase: 41 U/L (ref 39–117)
Bilirubin, Direct: 0.1 mg/dL (ref 0.0–0.3)
Total Bilirubin: 0.5 mg/dL (ref 0.2–1.2)
Total Protein: 7.5 g/dL (ref 6.0–8.3)

## 2021-10-05 NOTE — Assessment & Plan Note (Signed)
Chronic problem.  Is only using Ambien sparingly throughout the month.  Reports he is able to fall asleep w/o difficulty but has trouble staying asleep.  Will continue to provide refills.

## 2021-10-05 NOTE — Patient Instructions (Signed)
Follow up in 6 months to recheck cholesterol We'll notify you of your lab results and make any changes if needed Keep up the good work on healthy diet and regular exercise- you look great! Call with any questions or concerns Stay Safe!  Stay Healthy! Have a great summer!!!

## 2021-10-05 NOTE — Progress Notes (Signed)
   Subjective:    Patient ID: HEVER CASTILLEJA, male    DOB: 1955/08/18, 66 y.o.   MRN: [id number]  HPI Hyperlipidemia- chronic problem, on Lipitor '10mg'$  daily.  Continues to exercise daily- walking 6-7 miles/day.  Golfing weekly.  Insomnia- chronic problem, on Ambien sparingly.  Doesn't have trouble falling asleep but difficulty staying asleep.  Due for Pneumovax 23 today.   Review of Systems For ROS see HPI     Objective:   Physical Exam Vitals reviewed.  Constitutional:      General: He is not in acute distress.    Appearance: Normal appearance. He is well-developed. He is not ill-appearing.  HENT:     Head: Normocephalic and atraumatic.  Eyes:     Extraocular Movements: Extraocular movements intact.     Conjunctiva/sclera: Conjunctivae normal.     Pupils: Pupils are equal, round, and reactive to light.  Neck:     Thyroid: No thyromegaly.  Cardiovascular:     Rate and Rhythm: Normal rate and regular rhythm.     Pulses: Normal pulses.     Heart sounds: Normal heart sounds. No murmur heard. Pulmonary:     Effort: Pulmonary effort is normal. No respiratory distress.     Breath sounds: Normal breath sounds.  Abdominal:     General: Bowel sounds are normal. There is no distension.     Palpations: Abdomen is soft.  Musculoskeletal:     Cervical back: Normal range of motion and neck supple.     Right lower leg: No edema.     Left lower leg: No edema.  Lymphadenopathy:     Cervical: No cervical adenopathy.  Skin:    General: Skin is warm and dry.  Neurological:     General: No focal deficit present.     Mental Status: He is alert and oriented to person, place, and time.     Cranial Nerves: No cranial nerve deficit.  Psychiatric:        Mood and Affect: Mood normal.        Behavior: Behavior normal.           Assessment & Plan:

## 2021-10-05 NOTE — Assessment & Plan Note (Signed)
Chronic problem.  On Lipitor '10mg'$  daily w/o difficulty.  Applauded his regular exercise.  Check labs.  Adjust meds prn

## 2021-10-06 NOTE — Progress Notes (Signed)
Pt seen results VIA my chart  

## 2021-11-10 ENCOUNTER — Other Ambulatory Visit: Payer: Self-pay

## 2021-11-10 DIAGNOSIS — E785 Hyperlipidemia, unspecified: Secondary | ICD-10-CM

## 2021-11-10 MED ORDER — ATORVASTATIN CALCIUM 10 MG PO TABS: ORAL_TABLET | ORAL | 1 refills | Status: DC

## 2022-02-22 ENCOUNTER — Encounter: Payer: Self-pay | Admitting: Family Medicine

## 2022-02-23 NOTE — Telephone Encounter (Signed)
I will address the refill fax concern but I do see where patient is showing as due for both shingels vaccines can you please advise?

## 2022-02-27 ENCOUNTER — Other Ambulatory Visit: Payer: Self-pay

## 2022-02-27 MED ORDER — ZOLPIDEM TARTRATE 10 MG PO TABS: ORAL_TABLET | ORAL | 3 refills | Status: DC

## 2022-02-27 NOTE — Telephone Encounter (Signed)
Patient is requesting a refill of the following medications: Requested Prescriptions   Pending Prescriptions Disp Refills   zolpidem (AMBIEN) 10 MG tablet 15 tablet 3    Sig: TAKE ONE TABLET BY MOUTH EVERY NIGHT AT BEDTIME AS NEEDED FOR SLEEP    Date of patient request: 02/27/22 Last office visit: 10/05/21 Date of last refill: 09/16/21 Last refill amount: 15 Could not find e request

## 2022-02-27 NOTE — Telephone Encounter (Signed)
Pt has been requesting this for 4 days but it is an error with the efax.  Ambien 10 mg

## 2022-04-06 ENCOUNTER — Ambulatory Visit: Payer: Medicare Other | Admitting: Family Medicine

## 2022-04-12 ENCOUNTER — Ambulatory Visit: Payer: Medicare Other | Admitting: Family Medicine

## 2022-04-18 ENCOUNTER — Encounter: Payer: Self-pay | Admitting: Family Medicine

## 2022-04-18 ENCOUNTER — Ambulatory Visit (INDEPENDENT_AMBULATORY_CARE_PROVIDER_SITE_OTHER): Payer: Medicare Other | Admitting: Family Medicine

## 2022-04-18 VITALS — BP 110/68 | HR 50 | Temp 98.1°F | Resp 17 | Ht 69.0 in | Wt 168.5 lb

## 2022-04-18 DIAGNOSIS — R972 Elevated prostate specific antigen [PSA]: Secondary | ICD-10-CM | POA: Diagnosis not present

## 2022-04-18 DIAGNOSIS — E785 Hyperlipidemia, unspecified: Secondary | ICD-10-CM

## 2022-04-18 DIAGNOSIS — E663 Overweight: Secondary | ICD-10-CM

## 2022-04-18 DIAGNOSIS — Z125 Encounter for screening for malignant neoplasm of prostate: Secondary | ICD-10-CM

## 2022-04-18 LAB — CBC WITH DIFFERENTIAL/PLATELET
Basophils Absolute: 0.1 10*3/uL (ref 0.0–0.1)
Basophils Relative: 0.8 % (ref 0.0–3.0)
Eosinophils Absolute: 0.1 10*3/uL (ref 0.0–0.7)
Eosinophils Relative: 1.3 % (ref 0.0–5.0)
HCT: 37.5 % — ABNORMAL LOW (ref 39.0–52.0)
Hemoglobin: 12.6 g/dL — ABNORMAL LOW (ref 13.0–17.0)
Lymphocytes Relative: 21 % (ref 12.0–46.0)
Lymphs Abs: 1.8 10*3/uL (ref 0.7–4.0)
MCHC: 33.7 g/dL (ref 30.0–36.0)
MCV: 93.6 fl (ref 78.0–100.0)
Monocytes Absolute: 0.5 10*3/uL (ref 0.1–1.0)
Monocytes Relative: 5.6 % (ref 3.0–12.0)
Neutro Abs: 6 10*3/uL (ref 1.4–7.7)
Neutrophils Relative %: 71.3 % (ref 43.0–77.0)
Platelets: 352 10*3/uL (ref 150.0–400.0)
RBC: 4.01 Mil/uL — ABNORMAL LOW (ref 4.22–5.81)
RDW: 15.1 % (ref 11.5–15.5)
WBC: 8.4 10*3/uL (ref 4.0–10.5)

## 2022-04-18 LAB — HEPATIC FUNCTION PANEL
ALT: 15 U/L (ref 0–53)
AST: 19 U/L (ref 0–37)
Albumin: 4.5 g/dL (ref 3.5–5.2)
Alkaline Phosphatase: 41 U/L (ref 39–117)
Bilirubin, Direct: 0.1 mg/dL (ref 0.0–0.3)
Total Bilirubin: 0.5 mg/dL (ref 0.2–1.2)
Total Protein: 7 g/dL (ref 6.0–8.3)

## 2022-04-18 LAB — BASIC METABOLIC PANEL
BUN: 21 mg/dL (ref 6–23)
CO2: 25 mEq/L (ref 19–32)
Calcium: 9.4 mg/dL (ref 8.4–10.5)
Chloride: 104 mEq/L (ref 96–112)
Creatinine, Ser: 1.2 mg/dL (ref 0.40–1.50)
GFR: 62.6 mL/min (ref >60.00)
Glucose, Bld: 94 mg/dL (ref 70–99)
Potassium: 5.3 mEq/L — ABNORMAL HIGH (ref 3.5–5.1)
Sodium: 138 mEq/L (ref 135–145)

## 2022-04-18 LAB — LIPID PANEL
Cholesterol: 194 mg/dL (ref 0–200)
HDL: 77 mg/dL (ref >39.00)
LDL Cholesterol: 108 mg/dL — ABNORMAL HIGH (ref 0–99)
NonHDL: 117.3
Total CHOL/HDL Ratio: 3
Triglycerides: 48 mg/dL (ref 0.0–149.0)
VLDL: 9.6 mg/dL (ref 0.0–40.0)

## 2022-04-18 LAB — TSH: TSH: 2.52 u[IU]/mL (ref 0.35–5.50)

## 2022-04-18 LAB — PSA, MEDICARE: PSA: 7.57 ng/ml — ABNORMAL HIGH (ref 0.10–4.00)

## 2022-04-18 NOTE — Assessment & Plan Note (Signed)
Ongoing issue for pt.  Saw Urology in May and they indicated that q6 month PSA levels would be appropriate.  Will get today to monitor.  Pt expressed understanding and is in agreement w/ plan.

## 2022-04-18 NOTE — Progress Notes (Signed)
   Subjective:    Patient ID: Martin Malone, male    DOB: 10-21-1955, 67 y.o.   MRN: [id number]  HPI Hyperlipidemia- chronic problem, on Lipitor '10mg'$  daily and fish oil.  No CP, SOB, HA's, abd pain, N/V.  Overweight- pt is down 8 lbs since last visit.  BMI now 24.88.  'i'm where I want to be'.  Feeling good.  Continues to walk and run regularly, plays golf.    Elevated PSA- last PSA 5.39  Did see Urology in May and it was determined that 6 month surveillance would be appropriate.  Due for labs today.   Review of Systems For ROS see HPI     Objective:   Physical Exam Vitals reviewed.  Constitutional:      General: He is not in acute distress.    Appearance: Normal appearance. He is well-developed. He is not ill-appearing.  HENT:     Head: Normocephalic and atraumatic.  Eyes:     Extraocular Movements: Extraocular movements intact.     Conjunctiva/sclera: Conjunctivae normal.     Pupils: Pupils are equal, round, and reactive to light.  Neck:     Thyroid: No thyromegaly.  Cardiovascular:     Rate and Rhythm: Normal rate and regular rhythm.     Pulses: Normal pulses.     Heart sounds: Normal heart sounds. No murmur heard. Pulmonary:     Effort: Pulmonary effort is normal. No respiratory distress.     Breath sounds: Normal breath sounds.  Abdominal:     General: Bowel sounds are normal. There is no distension.     Palpations: Abdomen is soft.  Musculoskeletal:     Cervical back: Normal range of motion and neck supple.     Right lower leg: No edema.     Left lower leg: No edema.  Lymphadenopathy:     Cervical: No cervical adenopathy.  Skin:    General: Skin is warm and dry.  Neurological:     General: No focal deficit present.     Mental Status: He is alert and oriented to person, place, and time.     Cranial Nerves: No cranial nerve deficit.  Psychiatric:        Mood and Affect: Mood normal.        Behavior: Behavior normal.           Assessment & Plan:    Overweight- resolved.  Pt is down another 8 lbs since last visit and BMI is now 24.88  Applauded his efforts at healthy diet and regular exercise.  Will continue to follow.

## 2022-04-18 NOTE — Assessment & Plan Note (Signed)
Chronic problem.  On Lipitor '10mg'$  daily and Fish Oil w/o difficulty.  Exercising regularly.  Check labs.  Adjust meds prn

## 2022-04-18 NOTE — Patient Instructions (Signed)
Follow up in 6 months to recheck cholesterol and prostate We'll notify you of your lab results and make any changes if needed Keep up the good work on healthy diet and regular exercise- you look great!!! Call with any questions or concerns Stay Safe!  Stay Healthy! Happy New Year!!!

## 2022-04-19 ENCOUNTER — Telehealth: Payer: Self-pay

## 2022-04-19 NOTE — Telephone Encounter (Signed)
Pt seen results Via my chart  

## 2022-04-19 NOTE — Telephone Encounter (Signed)
-----   Message from Midge Minium, MD sent at 04/18/2022  4:36 PM EST ----- Labs look great w/ exception of PSA which has again jumped- this time up to 7.57  I will send a copy of these labs to Dr Milford Cage at [facility] as I think you should follow up with him regarding this continued climb.

## 2022-05-08 ENCOUNTER — Ambulatory Visit: Payer: Medicare Other | Admitting: Podiatry

## 2022-05-09 ENCOUNTER — Other Ambulatory Visit: Payer: Self-pay

## 2022-05-09 DIAGNOSIS — E785 Hyperlipidemia, unspecified: Secondary | ICD-10-CM

## 2022-05-09 MED ORDER — ATORVASTATIN CALCIUM 10 MG PO TABS: ORAL_TABLET | ORAL | 1 refills | Status: DC

## 2022-05-11 ENCOUNTER — Ambulatory Visit (INDEPENDENT_AMBULATORY_CARE_PROVIDER_SITE_OTHER): Payer: Medicare Other

## 2022-05-11 ENCOUNTER — Ambulatory Visit (INDEPENDENT_AMBULATORY_CARE_PROVIDER_SITE_OTHER): Payer: Medicare Other | Admitting: Podiatry

## 2022-05-11 DIAGNOSIS — M216X2 Other acquired deformities of left foot: Secondary | ICD-10-CM

## 2022-05-11 DIAGNOSIS — M216X1 Other acquired deformities of right foot: Secondary | ICD-10-CM

## 2022-05-11 DIAGNOSIS — L84 Corns and callosities: Secondary | ICD-10-CM | POA: Diagnosis not present

## 2022-05-11 DIAGNOSIS — M79671 Pain in right foot: Secondary | ICD-10-CM

## 2022-05-11 NOTE — Progress Notes (Signed)
Subjective:   Patient ID: Martin Malone, male   DOB: 67 y.o.   MRN: [id number]   HPI Chief Complaint  Patient presents with   Foot Problem    bil feet under 5th toes are are very tender / sore, months, intermittent     67 year old male presents the office for above concerns.  Points to the plantar 5th MTPJ on both with the left > right. This started about 3-4 months ago. No injuries. He walks a lot (average 10 miles a day). He has tried cortisone cream and antibacterial cream. The corisone cream heled a little. He does not see any swelling bu the can feel something there. He thought it was wart. When bad feels like he is walkign on the bone.    Review of Systems  All other systems reviewed and are negative.  Past Medical History:  Diagnosis Date   GERD (gastroesophageal reflux disease)    Hyperlipidemia    Rib fracture     Past Surgical History:  Procedure Laterality Date   TOTAL KNEE ARTHROPLASTY  2006     Current Outpatient Medications:    aspirin 81 MG tablet, Take 81 mg by mouth daily., Disp: , Rfl:    atorvastatin (LIPITOR) 10 MG tablet, TAKE ONE TABLET BY MOUTH EVERY NIGHT AT BEDTIME, Disp: 90 tablet, Rfl: 1   glucosamine-chondroitin 500-400 MG tablet, Take 1 tablet by mouth daily., Disp: , Rfl:    KRILL OIL PO, Take 2 tablets by mouth daily., Disp: , Rfl:    Multiple Vitamin (MULTIVITAMIN) tablet, Take 1 tablet by mouth daily., Disp: , Rfl:    Omega-3 Fatty Acids (FISH OIL) 500 MG CAPS, Take 500 mg by mouth 1 day or 1 dose., Disp: , Rfl:    zolpidem (AMBIEN) 10 MG tablet, TAKE ONE TABLET BY MOUTH EVERY NIGHT AT BEDTIME AS NEEDED FOR SLEEP, Disp: 15 tablet, Rfl: 3  No Known Allergies        Objective:  Physical Exam  General: AAO x3, NAD  Dermatological: Hyperkeratotic lesions noted submetatarsal 5 bilaterally left side worse than right without any underlying ulceration drainage or infection noted today.  No new open lesions.  Vascular: Dorsalis Pedis artery  and Posterior Tibial artery pedal pulses are 2/4 bilateral with immedate capillary fill time.  There is no pain with calf compression, swelling, warmth, erythema.   Neruologic: Grossly intact via light touch bilateral.   Musculoskeletal: Upon weightbearing is a rectus foot type.  There is prominence of the fifth metatarsal head plantarly bilaterally causing discomfort resulting in hyperkeratotic lesions.  There is no area pinpoint tenderness noted otherwise.  MMT 5/5.  Gait: Unassisted, Nonantalgic.       Assessment:   67 year old male with metatarsalgia, hyperkeratotic lesion     Plan:  -Treatment options discussed including all alternatives, risks, and complications -Etiology of symptoms were discussed -X-rays were obtained and reviewed with the patient.  3 views of the feet were obtained.  There is no evidence of acute fracture. -Consider acute debrided the callus without any complications or bleeding.  Recommend moisturizer, offloading the dispensed metatarsal pads.  We also discussed wearing shoes with better support to help take pressure off.  Trula Slade DPM

## 2022-05-20 ENCOUNTER — Encounter: Payer: Self-pay | Admitting: Family Medicine

## 2022-09-06 ENCOUNTER — Ambulatory Visit (INDEPENDENT_AMBULATORY_CARE_PROVIDER_SITE_OTHER): Payer: Medicare Other | Admitting: *Deleted

## 2022-09-06 DIAGNOSIS — Z0000 Encounter for general adult medical examination without abnormal findings: Secondary | ICD-10-CM

## 2022-09-06 NOTE — Progress Notes (Signed)
Subjective:   Martin Malone is a 67 y.o. male who presents for Medicare Annual/Subsequent preventive examination.  I connected with  Martin Malone on 09/06/22 by a telephone enabled telemedicine application and verified that I am speaking with the correct person using two identifiers.   I discussed the limitations of evaluation and management by telemedicine. The patient expressed understanding and agreed to proceed.  Patient location: home  Provider location: telephone/home    Review of Systems     Cardiac Risk Factors include: advanced age (>74men, >72 women);male gender     Objective:    Today's Vitals   There is no height or weight on file to calculate BMI.     09/06/2022    9:04 AM 09/01/2021    4:55 PM 01/27/2020    8:57 AM  Advanced Directives  Does Patient Have a Medical Advance Directive? Yes Yes Yes  Type of Advance Directive Living will;Healthcare Power of Attorney Living will;Healthcare Power of [employer] Power of [place];Living will  Does patient want to make changes to medical advance directive?   Yes (MAU/Ambulatory/Procedural Areas - Information given)  Copy of Healthcare Power of Attorney in Chart? No - copy requested No - copy requested No - copy requested    Current Medications (verified) Outpatient Encounter Medications as of 09/06/2022  Medication Sig   aspirin 81 MG tablet Take 81 mg by mouth daily.   atorvastatin (LIPITOR) 10 MG tablet TAKE ONE TABLET BY MOUTH EVERY NIGHT AT BEDTIME   glucosamine-chondroitin 500-400 MG tablet Take 1 tablet by mouth daily.   KRILL OIL PO Take 2 tablets by mouth daily.   Multiple Vitamin (MULTIVITAMIN) tablet Take 1 tablet by mouth daily.   Omega-3 Fatty Acids (FISH OIL) 500 MG CAPS Take 500 mg by mouth 1 day or 1 dose.   zolpidem (AMBIEN) 10 MG tablet TAKE ONE TABLET BY MOUTH EVERY NIGHT AT BEDTIME AS NEEDED FOR SLEEP   No facility-administered encounter medications on file as of 09/06/2022.     Allergies (verified) Patient has no known allergies.   History: Past Medical History:  Diagnosis Date   GERD (gastroesophageal reflux disease)    Hyperlipidemia    Rib fracture    Past Surgical History:  Procedure Laterality Date   TOTAL KNEE ARTHROPLASTY  2006   Family History  Problem Relation Age of Onset   Coronary artery disease Brother    Dementia Mother    Pancreatitis Father    Social History   Socioeconomic History   Marital status: Married    Spouse name: [name]   Number of children: 1   Years of education: Not on file   Highest education level: Not on file  Occupational History   Occupation: Advice worker  Tobacco Use   Smoking status: Former    Types: Cigarettes    Quit date: 04/17/1977    Years since quitting: 45.4   Smokeless tobacco: Never  Vaping Use   Vaping Use: Never used  Substance and Sexual Activity   Alcohol use: Not Currently    Alcohol/week: 2.0 standard drinks of alcohol    Types: 2 Glasses of wine per week   Drug use: Never   Sexual activity: Yes  Other Topics Concern   Not on file  Social History Narrative   Not on file   Social Determinants of Health   Financial Resource Strain: Low Risk  (09/06/2022)   Overall Financial Resource Strain (CARDIA)    Difficulty of Paying Living Expenses:  Not hard at all  Food Insecurity: No Food Insecurity (09/06/2022)   Hunger Vital Sign    Worried About Running Out of Food in the Last Year: Never true    Ran Out of Food in the Last Year: Never true  Transportation Needs: No Transportation Needs (09/06/2022)   PRAPARE - Administrator, Civil Service (Medical): No    Lack of Transportation (Non-Medical): No  Physical Activity: Sufficiently Active (09/06/2022)   Exercise Vital Sign    Days of Exercise per Week: 4 days    Minutes of Exercise per Session: 50 min  Stress: No Stress Concern Present (09/06/2022)   [employer] of Occupational Health - Occupational Stress  Questionnaire    Feeling of Stress : Not at all  Social Connections: Moderately Integrated (09/06/2022)   Social Connection and Isolation Panel [NHANES]    Frequency of Communication with Friends and Family: Never    Frequency of Social Gatherings with Friends and Family: More than three times a week    Attends Religious Services: Never    Database administrator or Organizations: Yes    Attends Engineer, structural: More than 4 times per year    Marital Status: Married    Tobacco Counseling Counseling given: Not Answered   Clinical Intake:  Pre-visit preparation completed: Yes  Pain : No/denies pain     Diabetes: No  How often do you need to have someone help you when you read instructions, pamphlets, or other written materials from your doctor or pharmacy?: 1 - Never  Diabetic?  no  Interpreter Needed?: No  Information entered by :: [name]   Activities of Daily Living    09/06/2022    9:06 AM 04/18/2022   10:13 AM  In your present state of health, do you have any difficulty performing the following activities:  Hearing? 0 0  Vision? 0 0  Difficulty concentrating or making decisions? 0 0  Walking or climbing stairs? 0 0  Dressing or bathing? 0 0  Doing errands, shopping? 0 0  Preparing Food and eating ? N   Using the Toilet? N   In the past six months, have you accidently leaked urine? N   Do you have problems with loss of bowel control? N   Managing your Medications? N   Managing your Finances? N     Patient Care Team: Sheliah Hatch, MD as PCP - General (Family Medicine) Tonny Bollman, MD as Consulting Physician (Cardiology)  Indicate any recent Medical Services you may have received from other than [facility] providers in the past year (date may be approximate).     Assessment:   This is a routine wellness examination for [name].  Hearing/Vision screen Hearing Screening - Comments:: No trouble hearing Vision Screening - Comments::  Up to date [name]  Dietary issues and exercise activities discussed: Current Exercise Habits: Home exercise routine, Type of exercise: walking (golf), Time (Minutes): 50, Frequency (Times/Week): 4, Weekly Exercise (Minutes/Week): 200, Intensity: Mild   Goals Addressed             This Visit's Progress    Patient Stated       Continue current lifestyle       Depression Screen    09/06/2022    9:09 AM 04/18/2022   10:12 AM 10/05/2021    8:56 AM 09/01/2021    4:54 PM 01/24/2021   10:30 AM 07/29/2020    8:40 AM 01/27/2020    8:55 AM  PHQ 2/9 Scores  PHQ - 2 Score 0 0 0 0 0 0 0  PHQ- 9 Score 0 0 0  0 0 0    Fall Risk    09/06/2022    9:03 AM 04/18/2022   10:12 AM 10/05/2021    8:56 AM 09/01/2021    3:40 PM 01/24/2021   10:29 AM  Fall Risk   Falls in the past year? 0 0 0 0 0  Number falls in past yr: 0 0 0 0 0  Injury with Fall? 0 0 0 0 0  Risk for fall due to :  No Fall Risks No Fall Risks No Fall Risks No Fall Risks  Follow up Falls evaluation completed;Education provided;Falls prevention discussed Falls evaluation completed Falls evaluation completed Falls prevention discussed     FALL RISK PREVENTION PERTAINING TO THE HOME:  Any stairs in or around the home? Yes  If so, are there any without handrails? No  Home free of loose throw rugs in walkways, pet beds, electrical cords, etc? Yes  Adequate lighting in your home to reduce risk of falls? Yes   ASSISTIVE DEVICES UTILIZED TO PREVENT FALLS:  Life alert? No  Use of a cane, walker or w/c? No  Grab bars in the bathroom? No  Shower chair or bench in shower? No  Elevated toilet seat or a handicapped toilet? No   TIMED UP AND GO:  Was the test performed? No .    Cognitive Function:        09/06/2022    9:07 AM 09/01/2021    4:57 PM  6CIT Screen  What Year? 0 points 0 points  What month? 0 points 0 points  What time? 0 points 0 points  Count back from 20 0 points 0 points  Months in reverse 0 points 0 points   Repeat phrase 0 points 0 points  Total Score 0 points 0 points    Immunizations Immunization History  Administered Date(s) Administered   Fluad Quad(high Dose 65+) 01/27/2020, 01/24/2021   Influenza, Seasonal, Injecte, Preservative Fre 01/17/2015   Influenza,inj,Quad PF,6+ Mos 01/15/2013, 01/15/2014, 12/22/2015, 12/28/2016, 01/21/2018, 01/23/2019   Influenza-Unspecified 01/13/2022   PFIZER(Purple Top)SARS-COV-2 Vaccination 07/11/2019, 08/01/2019, 02/11/2020, 12/09/2020, 02/09/2021   Pfizer Covid-19 Vaccine Bivalent Booster 81yrs & up 02/03/2021   Pneumococcal Conjugate-13 01/27/2020   Pneumococcal Polysaccharide-23 10/05/2021   Tdap 04/17/2008, 01/23/2019   Zoster, Live 05/28/2013    TDAP status: Up to date  Flu Vaccine status: Up to date  Pneumococcal vaccine status: Up to date  Covid-19 vaccine status: Information provided on how to obtain vaccines.   Qualifies for Shingles Vaccine? No   Zostavax completed Yes   Shingrix Completed?: No.    Education has been provided regarding the importance of this vaccine. Patient has been advised to call insurance company to determine out of pocket expense if they have not yet received this vaccine. Advised may also receive vaccine at local pharmacy or Health Dept. Verbalized acceptance and understanding.  Screening Tests Health Maintenance  Topic Date Due   INFLUENZA VACCINE  11/16/2022   Medicare Annual Wellness (AWV)  09/06/2023   COLONOSCOPY (Pts 45-32yrs Insurance coverage will need to be confirmed)  03/24/2027   DTaP/Tdap/Td (3 - Td or Tdap) 01/22/2029   Pneumonia Vaccine 38+ Years old  Completed   HPV VACCINES  Aged Out   COVID-19 Vaccine  Discontinued   Hepatitis C Screening  Discontinued   Zoster Vaccines- Shingrix  Discontinued    Health Maintenance  There  are no preventive care reminders to display for this patient.   Colorectal cancer screening: Type of screening: Colonoscopy. Completed 2023. Repeat every 5  years  Lung Cancer Screening: (Low Dose CT Chest recommended if Age 68-80 years, 30 pack-year currently smoking OR have quit w/in 15years.) does not qualify.   Lung Cancer Screening Referral:   Additional Screening:  Hepatitis C Screening: does not qualify; Completed 2021  Vision Screening: Recommended annual ophthalmology exams for early detection of glaucoma and other disorders of the eye. Is the patient up to date with their annual eye exam?  Yes  Who is the provider or what is the name of the office in which the patient attends annual eye exams? [name] If pt is not established with a provider, would they like to be referred to a provider to establish care? No .   Dental Screening: Recommended annual dental exams for proper oral hygiene  Community Resource Referral / Chronic Care Management: CRR required this visit?  No   CCM required this visit?  No      Plan:     I have personally reviewed and noted the following in the patient's chart:   Medical and social history Use of alcohol, tobacco or illicit drugs  Current medications and supplements including opioid prescriptions. Patient is not currently taking opioid prescriptions. Functional ability and status Nutritional status Physical activity Advanced directives List of other physicians Hospitalizations, surgeries, and ER visits in previous 12 months Vitals Screenings to include cognitive, depression, and falls Referrals and appointments  In addition, I have reviewed and discussed with patient certain preventive protocols, quality metrics, and best practice recommendations. A written personalized care plan for preventive services as well as general preventive health recommendations were provided to patient.     Remi Haggard, LPN   1/61/0960   Nurse Notes:

## 2022-09-07 ENCOUNTER — Other Ambulatory Visit: Payer: Self-pay

## 2022-09-07 MED ORDER — ZOLPIDEM TARTRATE 10 MG PO TABS: ORAL_TABLET | ORAL | 3 refills | Status: DC

## 2022-09-07 NOTE — Telephone Encounter (Signed)
Zolpidem 10 mg LOV: 04/18/22 Last Refill:02/27/22 Upcoming appt: 10/18/22

## 2022-09-07 NOTE — Telephone Encounter (Signed)
Left vm that rx has been sent in

## 2022-10-18 ENCOUNTER — Ambulatory Visit: Payer: Medicare Other | Admitting: Family Medicine

## 2022-10-27 ENCOUNTER — Encounter: Payer: Self-pay | Admitting: Family Medicine

## 2022-10-27 ENCOUNTER — Ambulatory Visit (INDEPENDENT_AMBULATORY_CARE_PROVIDER_SITE_OTHER): Payer: Medicare Other | Admitting: Family Medicine

## 2022-10-27 DIAGNOSIS — E785 Hyperlipidemia, unspecified: Secondary | ICD-10-CM | POA: Diagnosis not present

## 2022-10-27 LAB — BASIC METABOLIC PANEL
BUN: 26 mg/dL — ABNORMAL HIGH (ref 6–23)
CO2: 27 mEq/L (ref 19–32)
Calcium: 9.8 mg/dL (ref 8.4–10.5)
Chloride: 103 mEq/L (ref 96–112)
Creatinine, Ser: 1.26 mg/dL (ref 0.40–1.50)
GFR: 58.82 mL/min — ABNORMAL LOW (ref >60.00)
Glucose, Bld: 100 mg/dL — ABNORMAL HIGH (ref 70–99)
Potassium: 5.6 mEq/L — ABNORMAL HIGH (ref 3.5–5.1)
Sodium: 137 mEq/L (ref 135–145)

## 2022-10-27 LAB — HEPATIC FUNCTION PANEL
ALT: 17 U/L (ref 0–53)
AST: 22 U/L (ref 0–37)
Albumin: 4.7 g/dL (ref 3.5–5.2)
Alkaline Phosphatase: 46 U/L (ref 39–117)
Bilirubin, Direct: 0.1 mg/dL (ref 0.0–0.3)
Total Bilirubin: 0.5 mg/dL (ref 0.2–1.2)
Total Protein: 7.6 g/dL (ref 6.0–8.3)

## 2022-10-27 LAB — LIPID PANEL
Cholesterol: 184 mg/dL (ref 0–200)
HDL: 73.9 mg/dL (ref >39.00)
LDL Cholesterol: 99 mg/dL (ref 0–99)
NonHDL: 110.02
Total CHOL/HDL Ratio: 2
Triglycerides: 53 mg/dL (ref 0.0–149.0)
VLDL: 10.6 mg/dL (ref 0.0–40.0)

## 2022-10-27 LAB — CBC WITH DIFFERENTIAL/PLATELET
Basophils Absolute: 0.1 10*3/uL (ref 0.0–0.1)
Basophils Relative: 0.8 % (ref 0.0–3.0)
Eosinophils Absolute: 0.1 10*3/uL (ref 0.0–0.7)
Eosinophils Relative: 0.6 % (ref 0.0–5.0)
HCT: 40.3 % (ref 39.0–52.0)
Hemoglobin: 13.7 g/dL (ref 13.0–17.0)
Lymphocytes Relative: 20.2 % (ref 12.0–46.0)
Lymphs Abs: 1.8 10*3/uL (ref 0.7–4.0)
MCHC: 34.1 g/dL (ref 30.0–36.0)
MCV: 93.2 fl (ref 78.0–100.0)
Monocytes Absolute: 0.5 10*3/uL (ref 0.1–1.0)
Monocytes Relative: 5.3 % (ref 3.0–12.0)
Neutro Abs: 6.4 10*3/uL (ref 1.4–7.7)
Neutrophils Relative %: 73.1 % (ref 43.0–77.0)
Platelets: 308 10*3/uL (ref 150.0–400.0)
RBC: 4.33 Mil/uL (ref 4.22–5.81)
RDW: 15.4 % (ref 11.5–15.5)
WBC: 8.7 10*3/uL (ref 4.0–10.5)

## 2022-10-27 LAB — TSH: TSH: 1.27 u[IU]/mL (ref 0.35–5.50)

## 2022-10-27 MED ORDER — ATORVASTATIN CALCIUM 10 MG PO TABS
ORAL_TABLET | ORAL | 1 refills | Status: DC
Start: 2022-10-27 — End: 2023-05-01

## 2022-10-27 NOTE — Assessment & Plan Note (Signed)
Chronic problem.  Currently tolerating Lipitor 10mg  daily w/o difficulty.  Continues to be very physically active.  Check labs.  Adjust meds prn

## 2022-10-27 NOTE — Progress Notes (Signed)
   Subjective:    Patient ID: Martin Malone, male    DOB: 05-20-1955, 67 y.o.   MRN: [id number]  HPI Hyperlipidemia- chronic problem, on Lipitor 10mg  daily.  No CP, SOB, abd pain, N/V.  Exercising regularly- walking daily and playing golf multiple times weekly.   Review of Systems For ROS see HPI     Objective:   Physical Exam Vitals reviewed.  Constitutional:      General: He is not in acute distress.    Appearance: Normal appearance. He is well-developed. He is not ill-appearing.  HENT:     Head: Normocephalic and atraumatic.  Eyes:     Extraocular Movements: Extraocular movements intact.     Conjunctiva/sclera: Conjunctivae normal.     Pupils: Pupils are equal, round, and reactive to light.  Neck:     Thyroid: No thyromegaly.  Cardiovascular:     Rate and Rhythm: Normal rate and regular rhythm.     Pulses: Normal pulses.     Heart sounds: Normal heart sounds. No murmur heard. Pulmonary:     Effort: Pulmonary effort is normal. No respiratory distress.     Breath sounds: Normal breath sounds.  Abdominal:     General: Bowel sounds are normal. There is no distension.     Palpations: Abdomen is soft.  Musculoskeletal:     Cervical back: Normal range of motion and neck supple.     Right lower leg: No edema.     Left lower leg: No edema.  Lymphadenopathy:     Cervical: No cervical adenopathy.  Skin:    General: Skin is warm and dry.  Neurological:     General: No focal deficit present.     Mental Status: He is alert and oriented to person, place, and time.     Cranial Nerves: No cranial nerve deficit.  Psychiatric:        Mood and Affect: Mood normal.        Behavior: Behavior normal.           Assessment & Plan:

## 2022-10-27 NOTE — Patient Instructions (Signed)
Follow up in 6 months to recheck cholesterol We'll notify you of your lab results and make any changes if needed Keep up the good work on healthy diet and regular exercise- you look great! Call with any questions or concerns Stay Safe!  Stay Healthy! Happy [place] Ouch!!

## 2022-10-30 ENCOUNTER — Telehealth: Payer: Self-pay

## 2022-10-30 ENCOUNTER — Encounter: Payer: Self-pay | Admitting: Family Medicine

## 2022-10-30 ENCOUNTER — Other Ambulatory Visit: Payer: Self-pay

## 2022-10-30 DIAGNOSIS — E875 Hyperkalemia: Secondary | ICD-10-CM

## 2022-10-30 NOTE — Telephone Encounter (Signed)
-----   Message from Neena Rhymes sent at 10/30/2022  7:29 AM EDT ----- Labs are stable and look great w/ exception of mildly elevated potassium.  We will repeat your BMP at a lab only visit in 1 week (dx hyperkalemia) to ensure this is back in the normal range.

## 2022-10-30 NOTE — Telephone Encounter (Signed)
Pt is aware of lab results and repeat BMP order is in . Lab only apt has been made

## 2022-11-10 ENCOUNTER — Other Ambulatory Visit (INDEPENDENT_AMBULATORY_CARE_PROVIDER_SITE_OTHER): Payer: Medicare Other

## 2022-11-10 DIAGNOSIS — E875 Hyperkalemia: Secondary | ICD-10-CM

## 2022-11-10 LAB — BASIC METABOLIC PANEL
BUN: 27 mg/dL — ABNORMAL HIGH (ref 6–23)
CO2: 28 mEq/L (ref 19–32)
Calcium: 9.4 mg/dL (ref 8.4–10.5)
Chloride: 103 mEq/L (ref 96–112)
Creatinine, Ser: 1.28 mg/dL (ref 0.40–1.50)
GFR: 57.71 mL/min — ABNORMAL LOW (ref >60.00)
Glucose, Bld: 82 mg/dL (ref 70–99)
Potassium: 5.1 mEq/L (ref 3.5–5.1)
Sodium: 138 mEq/L (ref 135–145)

## 2022-11-13 ENCOUNTER — Telehealth: Payer: Self-pay

## 2022-11-13 NOTE — Telephone Encounter (Signed)
Pt is aware of lab results.

## 2022-11-13 NOTE — Telephone Encounter (Signed)
-----   Message from Neena Rhymes sent at 11/13/2022  7:33 AM EDT ----- Potassium is now normal- great news!  Continue to drink plenty of water.  No other changes at this time

## 2023-01-02 ENCOUNTER — Encounter: Payer: Self-pay | Admitting: Family Medicine

## 2023-02-15 ENCOUNTER — Other Ambulatory Visit: Payer: Self-pay | Admitting: Urology

## 2023-02-15 DIAGNOSIS — R972 Elevated prostate specific antigen [PSA]: Secondary | ICD-10-CM

## 2023-02-27 ENCOUNTER — Telehealth: Payer: Self-pay

## 2023-02-27 MED ORDER — ZOLPIDEM TARTRATE 10 MG PO TABS: ORAL_TABLET | ORAL | 3 refills | Status: DC

## 2023-02-27 NOTE — Telephone Encounter (Signed)
Sent to Dr.Tabori.

## 2023-02-27 NOTE — Telephone Encounter (Signed)
Prescription sent as requested.

## 2023-02-27 NOTE — Telephone Encounter (Signed)
LM for pt prescription had been sent

## 2023-02-27 NOTE — Addendum Note (Signed)
Addended by: Sheliah Hatch on: 02/27/2023 01:20 PM   Modules accepted: Orders

## 2023-04-01 ENCOUNTER — Ambulatory Visit
Admission: RE | Admit: 2023-04-01 | Discharge: 2023-04-01 | Disposition: A | Discharge status: Home / Self Care | Payer: Medicare Other | Source: Ambulatory Visit | Attending: Urology | Admitting: Urology

## 2023-04-01 DIAGNOSIS — R972 Elevated prostate specific antigen [PSA]: Secondary | ICD-10-CM

## 2023-04-01 MED ORDER — GADOPICLENOL 0.5 MMOL/ML IV SOLN
9.0000 mL | Freq: Once | INTRAVENOUS | Status: AC | PRN
  Administered 2023-04-01: 9 mL via INTRAVENOUS

## 2023-04-30 ENCOUNTER — Telehealth: Payer: Self-pay

## 2023-04-30 ENCOUNTER — Encounter: Payer: Self-pay | Admitting: Family Medicine

## 2023-04-30 ENCOUNTER — Ambulatory Visit: Payer: Medicare Other | Admitting: Family Medicine

## 2023-04-30 VITALS — BP 112/68 | HR 78 | Temp 97.7°F | Ht 69.0 in | Wt 172.4 lb

## 2023-04-30 DIAGNOSIS — M7918 Myalgia, other site: Secondary | ICD-10-CM | POA: Diagnosis not present

## 2023-04-30 DIAGNOSIS — Z1159 Encounter for screening for other viral diseases: Secondary | ICD-10-CM

## 2023-04-30 DIAGNOSIS — E785 Hyperlipidemia, unspecified: Secondary | ICD-10-CM

## 2023-04-30 LAB — BASIC METABOLIC PANEL
BUN: 24 mg/dL — ABNORMAL HIGH (ref 6–23)
CO2: 27 meq/L (ref 19–32)
Calcium: 9.2 mg/dL (ref 8.4–10.5)
Chloride: 106 meq/L (ref 96–112)
Creatinine, Ser: 1.15 mg/dL (ref 0.40–1.50)
GFR: 65.4 mL/min (ref >60.00)
Glucose, Bld: 87 mg/dL (ref 70–99)
Potassium: 5.4 meq/L — ABNORMAL HIGH (ref 3.5–5.1)
Sodium: 137 meq/L (ref 135–145)

## 2023-04-30 LAB — HEPATIC FUNCTION PANEL
ALT: 18 U/L (ref 0–53)
AST: 23 U/L (ref 0–37)
Albumin: 4.5 g/dL (ref 3.5–5.2)
Alkaline Phosphatase: 38 U/L — ABNORMAL LOW (ref 39–117)
Bilirubin, Direct: 0.1 mg/dL (ref 0.0–0.3)
Total Bilirubin: 0.5 mg/dL (ref 0.2–1.2)
Total Protein: 7.1 g/dL (ref 6.0–8.3)

## 2023-04-30 LAB — LIPID PANEL
Cholesterol: 172 mg/dL (ref 0–200)
HDL: 62.6 mg/dL (ref >39.00)
LDL Cholesterol: 101 mg/dL — ABNORMAL HIGH (ref 0–99)
NonHDL: 109.52
Total CHOL/HDL Ratio: 3
Triglycerides: 44 mg/dL (ref 0.0–149.0)
VLDL: 8.8 mg/dL (ref 0.0–40.0)

## 2023-04-30 LAB — CBC WITH DIFFERENTIAL/PLATELET
Basophils Absolute: 0 10*3/uL (ref 0.0–0.1)
Basophils Relative: 0.4 % (ref 0.0–3.0)
Eosinophils Absolute: 0.2 10*3/uL (ref 0.0–0.7)
Eosinophils Relative: 2.3 % (ref 0.0–5.0)
HCT: 42.4 % (ref 39.0–52.0)
Hemoglobin: 14.1 g/dL (ref 13.0–17.0)
Lymphocytes Relative: 23.5 % (ref 12.0–46.0)
Lymphs Abs: 1.8 10*3/uL (ref 0.7–4.0)
MCHC: 33.2 g/dL (ref 30.0–36.0)
MCV: 94.3 fL (ref 78.0–100.0)
Monocytes Absolute: 0.5 10*3/uL (ref 0.1–1.0)
Monocytes Relative: 6.3 % (ref 3.0–12.0)
Neutro Abs: 5.2 10*3/uL (ref 1.4–7.7)
Neutrophils Relative %: 67.5 % (ref 43.0–77.0)
Platelets: 311 10*3/uL (ref 150.0–400.0)
RBC: 4.5 Mil/uL (ref 4.22–5.81)
RDW: 15.1 % (ref 11.5–15.5)
WBC: 7.7 10*3/uL (ref 4.0–10.5)

## 2023-04-30 LAB — TSH: TSH: 2.58 u[IU]/mL (ref 0.35–5.50)

## 2023-04-30 MED ORDER — MELOXICAM 15 MG PO TABS: 15.0000 mg | ORAL_TABLET | Freq: Every day | ORAL | 1 refills | Status: DC

## 2023-04-30 NOTE — Telephone Encounter (Signed)
 Pt has been notified.

## 2023-04-30 NOTE — Telephone Encounter (Signed)
-----   Message from Neena Rhymes sent at 04/30/2023  4:08 PM EST ----- Labs look good!  Potassium is up a little but no cause for concern.  Increase your water intake to flush your system.  No other changes at this time

## 2023-04-30 NOTE — Assessment & Plan Note (Signed)
Chronic problem.  Tolerating Lipitor 10mg daily w/o difficulty.  Check labs.  Adjust meds prn  ?

## 2023-04-30 NOTE — Patient Instructions (Signed)
 Follow up in 6 months to recheck cholesterol We'll notify you of your lab results and make any changes if needed START the Meloxicam  once daily w/ food for 7-10 days to help w/ inflammation.  Start after your procedure on Wednesday Keep up the good work!  You look great! Call with any questions or concerns Stay Safe!  Stay Healthy! Hang in there!!

## 2023-04-30 NOTE — Progress Notes (Signed)
   Subjective:    Patient ID: Martin Malone, male    DOB: 1955-09-21, 68 y.o.   MRN: [id number]  HPI Hyperlipidemia- chronic problem, on Lipitor 10mg  daily and fish oil.  Continues to walk 8-9 miles/day.  No CP, SOB, abd pain, N/V.  Buttock pain- 'it's on my butt bone'.  R sided.  Pain improves w/ massage.  Worse w/ sitting.  No change w/ tylenol  or ibuprofen.  Pt reports this has been ongoing 'for years'.  Pain is not constant- 'it comes and goes'.  Reports things have been more inflamed recently.   Review of Systems For ROS see HPI     Objective:   Physical Exam Vitals reviewed.  Constitutional:      General: He is not in acute distress.    Appearance: Normal appearance. He is well-developed. He is not ill-appearing.  HENT:     Head: Normocephalic and atraumatic.  Eyes:     Extraocular Movements: Extraocular movements intact.     Conjunctiva/sclera: Conjunctivae normal.     Pupils: Pupils are equal, round, and reactive to light.  Neck:     Thyroid : No thyromegaly.  Cardiovascular:     Rate and Rhythm: Normal rate and regular rhythm.     Pulses: Normal pulses.     Heart sounds: Normal heart sounds. No murmur heard. Pulmonary:     Effort: Pulmonary effort is normal. No respiratory distress.     Breath sounds: Normal breath sounds.  Abdominal:     General: Bowel sounds are normal. There is no distension.     Palpations: Abdomen is soft.  Musculoskeletal:     Cervical back: Normal range of motion and neck supple.     Right lower leg: No edema.     Left lower leg: No edema.  Lymphadenopathy:     Cervical: No cervical adenopathy.  Skin:    General: Skin is warm and dry.  Neurological:     General: No focal deficit present.     Mental Status: He is alert and oriented to person, place, and time.     Cranial Nerves: No cranial nerve deficit.  Psychiatric:        Mood and Affect: Mood normal.        Behavior: Behavior normal.           Assessment & Plan:  R  buttock pain- new to provider, ongoing for pt over the last few years.  Finds that pain will come and go and tends to depend on the amount of exercising he is doing.  Massage is helpful.  No notable improvement w/ OTC meds.  Will start scheduled 7-10 days of Meloxicam  to start after his prostate bx on Wednesday and then he can use prn.  Pt expressed understanding and is in agreement w/ plan.

## 2023-05-01 ENCOUNTER — Other Ambulatory Visit: Payer: Self-pay

## 2023-05-01 DIAGNOSIS — E785 Hyperlipidemia, unspecified: Secondary | ICD-10-CM

## 2023-05-01 LAB — HEPATITIS C ANTIBODY: Hepatitis C Ab: NONREACTIVE

## 2023-05-01 MED ORDER — ATORVASTATIN CALCIUM 10 MG PO TABS: ORAL_TABLET | ORAL | 1 refills | Status: DC

## 2023-05-10 ENCOUNTER — Other Ambulatory Visit (HOSPITAL_COMMUNITY): Payer: Self-pay | Admitting: Urology

## 2023-05-10 ENCOUNTER — Other Ambulatory Visit: Payer: Self-pay | Admitting: Urology

## 2023-05-10 DIAGNOSIS — C61 Malignant neoplasm of prostate: Secondary | ICD-10-CM

## 2023-05-21 ENCOUNTER — Encounter (HOSPITAL_COMMUNITY)
Admission: RE | Admit: 2023-05-21 | Discharge: 2023-05-21 | Disposition: A | Discharge status: Home / Self Care | Payer: Medicare Other | Source: Ambulatory Visit | Attending: Urology | Admitting: Urology

## 2023-05-21 ENCOUNTER — Encounter: Payer: Self-pay | Admitting: Family Medicine

## 2023-05-21 DIAGNOSIS — C61 Malignant neoplasm of prostate: Secondary | ICD-10-CM | POA: Insufficient documentation

## 2023-05-21 MED ORDER — FLOTUFOLASTAT F 18 GALLIUM 296-5846 MBQ/ML IV SOLN
8.2730 | Freq: Once | INTRAVENOUS | Status: AC
  Administered 2023-05-21: 8.273 via INTRAVENOUS

## 2023-05-21 NOTE — Telephone Encounter (Signed)
Patient sent a nice sentiment for you, doesn't appear there are questions on labs

## 2023-05-28 ENCOUNTER — Encounter: Payer: Self-pay | Admitting: Family Medicine

## 2023-07-04 NOTE — Patient Instructions (Addendum)
 SURGICAL WAITING ROOM VISITATION  Patients having surgery or a procedure may have no more than 2 support people in the waiting area - these visitors may rotate.    Children under the age of 57 must have an adult with them who is not the patient.  Due to an increase in RSV and influenza rates and associated hospitalizations, children ages 66 and under may not visit patients in [facility] hospitals.  Visitors with respiratory illnesses are discouraged from visiting and should remain at home.  If the patient needs to stay at the hospital during part of their recovery, the visitor guidelines for inpatient rooms apply. Pre-op nurse will coordinate an appropriate time for 1 support person to accompany patient in pre-op.  This support person may not rotate.    Please refer to the [facility] website for the visitor guidelines for Inpatients (after your surgery is over and you are in a regular room).       Your procedure is scheduled on: 07-16-23    Report to [facility] Main Entrance    Report to admitting at      0515  AM   Call this number if you have problems the morning of surgery [phone]   Do not eat food   OR DRINK LIQUIDS  :After Midnight.                          If you have questions, please contact your surgeon's office.   FOLLOW BOWEL PREP AND ANY ADDITIONAL PRE OP INSTRUCTIONS YOU RECEIVED FROM YOUR SURGEON'S OFFICE!!!              ONE 8OZ BOTTLE OF MAGNESIUM CITRATE AT NOON THE DAY BEFORE SURGERY              ONE FLEETS ENEMA THE NIGHT BEFORE SURGERY   Oral Hygiene is also important to reduce your risk of infection.                                    Remember - BRUSH YOUR TEETH THE MORNING OF SURGERY WITH YOUR REGULAR TOOTHPASTE  DENTURES WILL BE REMOVED PRIOR TO SURGERY PLEASE DO NOT APPLY "Poly grip" OR ADHESIVES!!!   Do NOT smoke after Midnight   Stop all vitamins and herbal supplements 7 days before surgery.   Take these medicines the morning of  surgery with A SIP OF WATER: loratadine and tylenol if needed  DO NOT TAKE ANY ORAL DIABETIC MEDICATIONS DAY OF YOUR SURGERY  Bring CPAP mask and tubing day of surgery.                              You may not have any metal on your body including hair pins, jewelry, and body piercing             Do not wear lotions, powders, cologne, or deodorant              Men may shave face and neck.   Do not bring valuables to the hospital. [facility] IS NOT             RESPONSIBLE   FOR VALUABLES.   Contacts, glasses, dentures or bridgework may not be worn into surgery.   Bring small overnight bag day of surgery.   DO NOT BRING YOUR  HOME MEDICATIONS TO THE HOSPITAL. PHARMACY WILL DISPENSE MEDICATIONS LISTED ON YOUR MEDICATION LIST TO YOU DURING YOUR ADMISSION IN THE HOSPITAL!    Patients discharged on the day of surgery will not be allowed to drive home.  Someone NEEDS to stay with you for the first 24 hours after anesthesia.   Special Instructions: Bring a copy of your healthcare power of attorney and living will documents the day of surgery if you haven't scanned them before.              Please read over the following fact sheets you were given: IF YOU HAVE QUESTIONS ABOUT YOUR PRE-OP INSTRUCTIONS PLEASE CALL [phone]    If you test positive for Covid or have been in contact with anyone that has tested positive in the last 10 days please notify you surgeon.    [facility] [facility] - Preparing for Surgery Before surgery, you can play an important role.  Because skin is not sterile, your skin needs to be as free of germs as possible.  You can reduce the number of germs on your skin by washing with CHG (chlorahexidine gluconate) soap before surgery.  CHG is an antiseptic cleaner which kills germs and bonds with the skin to continue killing germs even after washing. Please DO NOT use if you have an allergy to CHG or antibacterial soaps.  If your skin becomes reddened/irritated stop using the CHG  and inform your nurse when you arrive at Short Stay. Do not shave (including legs and underarms) for at least 48 hours prior to the first CHG shower.  You may shave your face/neck. Please follow these instructions carefully:  1.  Shower with CHG Soap the night before surgery and the  morning of Surgery.  2.  If you choose to wash your hair, wash your hair first as usual with your  normal  shampoo.  3.  After you shampoo, rinse your hair and body thoroughly to remove the  shampoo.                           4.  Use CHG as you would any other liquid soap.  You can apply chg directly  to the skin and wash                       Gently with a scrungie or clean washcloth.  5.  Apply the CHG Soap to your body ONLY FROM THE NECK DOWN.   Do not use on face/ open                           Wound or open sores. Avoid contact with eyes, ears mouth and genitals (private parts).                       Wash face,  Genitals (private parts) with your normal soap.             6.  Wash thoroughly, paying special attention to the area where your surgery  will be performed.  7.  Thoroughly rinse your body with warm water from the neck down.  8.  DO NOT shower/wash with your normal soap after using and rinsing off  the CHG Soap.                9.  Pat yourself dry with a clean towel.  10.  Wear clean pajamas.            11.  Place clean sheets on your bed the night of your first shower and do not  sleep with pets. Day of Surgery : Do not apply any lotions/deodorants the morning of surgery.  Please wear clean clothes to the hospital/surgery center.  FAILURE TO FOLLOW THESE INSTRUCTIONS MAY RESULT IN THE CANCELLATION OF YOUR SURGERY PATIENT SIGNATURE_________________________________  NURSE SIGNATURE__________________________________  ________________________________________________________________________ WHAT IS A BLOOD TRANSFUSION? Blood Transfusion Information  A transfusion is the replacement of blood  or some of its parts. Blood is made up of multiple cells which provide different functions. Red blood cells carry oxygen and are used for blood loss replacement. White blood cells fight against infection. Platelets control bleeding. Plasma helps clot blood. Other blood products are available for specialized needs, such as hemophilia or other clotting disorders. BEFORE THE TRANSFUSION  Who gives blood for transfusions?  Healthy volunteers who are fully evaluated to make sure their blood is safe. This is blood bank blood. Transfusion therapy is the safest it has ever been in the practice of medicine. Before blood is taken from a donor, a complete history is taken to make sure that person has no history of diseases nor engages in risky social behavior (examples are intravenous drug use or sexual activity with multiple partners). The donor's travel history is screened to minimize risk of transmitting infections, such as malaria. The donated blood is tested for signs of infectious diseases, such as HIV and hepatitis. The blood is then tested to be sure it is compatible with you in order to minimize the chance of a transfusion reaction. If you or a relative donates blood, this is often done in anticipation of surgery and is not appropriate for emergency situations. It takes many days to process the donated blood. RISKS AND COMPLICATIONS Although transfusion therapy is very safe and saves many lives, the main dangers of transfusion include:  Getting an infectious disease. Developing a transfusion reaction. This is an allergic reaction to something in the blood you were given. Every precaution is taken to prevent this. The decision to have a blood transfusion has been considered carefully by your caregiver before blood is given. Blood is not given unless the benefits outweigh the risks. AFTER THE TRANSFUSION Right after receiving a blood transfusion, you will usually feel much better and more energetic. This  is especially true if your red blood cells have gotten low (anemic). The transfusion raises the level of the red blood cells which carry oxygen, and this usually causes an energy increase. The nurse administering the transfusion will monitor you carefully for complications. HOME CARE INSTRUCTIONS  No special instructions are needed after a transfusion. You may find your energy is better. Speak with your caregiver about any limitations on activity for underlying diseases you may have. SEEK MEDICAL CARE IF:  Your condition is not improving after your transfusion. You develop redness or irritation at the intravenous (IV) site. SEEK IMMEDIATE MEDICAL CARE IF:  Any of the following symptoms occur over the next 12 hours: Shaking chills. You have a temperature by mouth above 102 F (38.9 C), not controlled by medicine. Chest, back, or muscle pain. People around you feel you are not acting correctly or are confused. Shortness of breath or difficulty breathing. Dizziness and fainting. You get a rash or develop hives. You have a decrease in urine output. Your urine turns a dark color or changes to pink, red, or  brown. Any of the following symptoms occur over the next 10 days: You have a temperature by mouth above 102 F (38.9 C), not controlled by medicine. Shortness of breath. Weakness after normal activity. The white part of the eye turns yellow (jaundice). You have a decrease in the amount of urine or are urinating less often. Your urine turns a dark color or changes to pink, red, or brown. Document Released: 03/31/2000 Document Revised: 06/26/2011 Document Reviewed: 11/18/2007 [facility] Patient Information 2014 [place], [place].  _______________________________________________________________________

## 2023-07-04 NOTE — Progress Notes (Signed)
 PCP - Neena Rhymes, MD  LOV 04-30-23 epic Cardiologist - no  not in years used to go to [facility] before retirement just to get checked out.  PPM/ICD -  Device Orders -  Rep Notified -   Chest x-ray -  EKG -  Stress Test -  ECHO -  Cardiac Cath -   Sleep Study -  CPAP -   Fasting Blood Sugar -  Checks Blood Sugar _____ times a day  Blood Thinner Instructions: Aspirin Instructions:81 mg asa 5 days stop  ERAS Protcol - PRE-SURGERY n/a   COVID vaccine -yes  Activity--Able to climb a flight of stairs without CP or SOB  walks 6-7 miles a day and aerobics 3 x a week  Anesthesia review:   Patient denies shortness of breath, fever, cough and chest pain at PAT appointment   All instructions explained to the patient, with a verbal understanding of the material. Patient agrees to go over the instructions while at home for a better understanding. Patient also instructed to self quarantine after being tested for COVID-19. The opportunity to ask questions was provided.

## 2023-07-05 ENCOUNTER — Encounter (HOSPITAL_COMMUNITY): Payer: Self-pay

## 2023-07-05 ENCOUNTER — Encounter (HOSPITAL_COMMUNITY)
Admission: RE | Admit: 2023-07-05 | Discharge: 2023-07-05 | Disposition: A | Discharge status: Home / Self Care | Payer: Medicare Other | Source: Ambulatory Visit | Attending: Urology | Admitting: Urology

## 2023-07-05 ENCOUNTER — Other Ambulatory Visit: Payer: Self-pay

## 2023-07-05 DIAGNOSIS — Z01818 Encounter for other preprocedural examination: Secondary | ICD-10-CM | POA: Insufficient documentation

## 2023-07-05 HISTORY — DX: Malignant (primary) neoplasm, unspecified: C80.1

## 2023-07-05 LAB — CBC
HCT: 41.5 % (ref 39.0–52.0)
Hemoglobin: 13.2 g/dL (ref 13.0–17.0)
MCH: 30.4 pg (ref 26.0–34.0)
MCHC: 31.8 g/dL (ref 30.0–36.0)
MCV: 95.6 fL (ref 80.0–100.0)
Platelets: 292 10*3/uL (ref 150–400)
RBC: 4.34 MIL/uL (ref 4.22–5.81)
RDW: 14.6 % (ref 11.5–15.5)
WBC: 8.4 10*3/uL (ref 4.0–10.5)
nRBC: 0 % (ref 0.0–0.2)

## 2023-07-05 LAB — BASIC METABOLIC PANEL
Anion gap: 6 (ref 5–15)
BUN: 23 mg/dL (ref 8–23)
CO2: 24 mmol/L (ref 22–32)
Calcium: 8.7 mg/dL — ABNORMAL LOW (ref 8.9–10.3)
Chloride: 104 mmol/L (ref 98–111)
Creatinine, Ser: 1.11 mg/dL (ref 0.61–1.24)
GFR, Estimated: >60 mL/min (ref >60)
Glucose, Bld: 97 mg/dL (ref 70–99)
Potassium: 4.6 mmol/L (ref 3.5–5.1)
Sodium: 134 mmol/L — ABNORMAL LOW (ref 135–145)

## 2023-07-05 LAB — TYPE AND SCREEN
ABO/RH(D): O POS
Antibody Screen: NEGATIVE

## 2023-07-13 NOTE — Anesthesia Preprocedure Evaluation (Addendum)
 Anesthesia Evaluation  Patient identified by MRN, date of birth, ID band Patient awake    Reviewed: Allergy & Precautions, NPO status , Patient's Chart, lab work & pertinent test results  History of Anesthesia Complications Negative for: history of anesthetic complications  Airway Mallampati: II  TM Distance: >3 FB Neck ROM: Full    Dental  (+) Dental Advisory Given   Pulmonary neg shortness of breath, neg sleep apnea, neg COPD, neg recent URI, former smoker   Pulmonary exam normal breath sounds clear to auscultation       Cardiovascular (-) hypertension(-) angina (-) Past MI, (-) Cardiac Stents and (-) CABG (-) dysrhythmias  Rhythm:Regular Rate:Bradycardia  HLD   Neuro/Psych negative neurological ROS     GI/Hepatic Neg liver ROS,GERD  ,,  Endo/Other  negative endocrine ROS    Renal/GU negative Renal ROS   Prostate cancer    Musculoskeletal   Abdominal   Peds  Hematology negative hematology ROS (+) Lab Results      Component                Value               Date                      WBC                      8.4                 07/05/2023                HGB                      13.2                07/05/2023                HCT                      41.5                07/05/2023                MCV                      95.6                07/05/2023                PLT                      292                 07/05/2023              Anesthesia Other Findings   Reproductive/Obstetrics                             Anesthesia Physical Anesthesia Plan  ASA: 2  Anesthesia Plan: General   Post-op Pain Management: Tylenol PO (pre-op)*   Induction: Intravenous  PONV Risk Score and Plan: 3 and Ondansetron, Dexamethasone and Treatment may vary due to age or medical condition  Airway Management Planned: Oral ETT  Additional Equipment:   Intra-op Plan:   Post-operative Plan: Extubation  in OR  Informed Consent: I have reviewed  the patients History and Physical, chart, labs and discussed the procedure including the risks, benefits and alternatives for the proposed anesthesia with the patient or authorized representative who has indicated his/her understanding and acceptance.     Dental advisory given  Plan Discussed with: CRNA and Anesthesiologist  Anesthesia Plan Comments: (Risks of general anesthesia discussed including, but not limited to, sore throat, hoarse voice, chipped/damaged teeth, injury to vocal cords, nausea and vomiting, allergic reactions, lung infection, heart attack, stroke, and death. All questions answered. )        Anesthesia Quick Evaluation

## 2023-07-16 ENCOUNTER — Encounter (HOSPITAL_COMMUNITY)
Admission: RE | Disposition: A | Discharge status: Home / Self Care | Payer: Self-pay | Source: Home / Self Care | Attending: Urology

## 2023-07-16 ENCOUNTER — Ambulatory Visit (HOSPITAL_COMMUNITY): Payer: Self-pay | Admitting: Anesthesiology

## 2023-07-16 ENCOUNTER — Observation Stay (HOSPITAL_COMMUNITY)
Admission: RE | Admit: 2023-07-16 | Discharge: 2023-07-17 | Disposition: A | Discharge status: Home / Self Care | Payer: Medicare Other | Attending: Urology | Admitting: Urology

## 2023-07-16 ENCOUNTER — Other Ambulatory Visit: Payer: Self-pay

## 2023-07-16 ENCOUNTER — Ambulatory Visit (HOSPITAL_BASED_OUTPATIENT_CLINIC_OR_DEPARTMENT_OTHER): Payer: Self-pay | Admitting: Anesthesiology

## 2023-07-16 ENCOUNTER — Encounter (HOSPITAL_COMMUNITY): Payer: Self-pay | Admitting: Urology

## 2023-07-16 DIAGNOSIS — Z96652 Presence of left artificial knee joint: Secondary | ICD-10-CM | POA: Insufficient documentation

## 2023-07-16 DIAGNOSIS — C61 Malignant neoplasm of prostate: Secondary | ICD-10-CM | POA: Diagnosis present

## 2023-07-16 DIAGNOSIS — Z7982 Long term (current) use of aspirin: Secondary | ICD-10-CM | POA: Diagnosis not present

## 2023-07-16 DIAGNOSIS — Z9079 Acquired absence of other genital organ(s): Principal | ICD-10-CM

## 2023-07-16 DIAGNOSIS — Z87891 Personal history of nicotine dependence: Secondary | ICD-10-CM | POA: Insufficient documentation

## 2023-07-16 HISTORY — PX: LYMPHADENECTOMY: SHX5960

## 2023-07-16 HISTORY — PX: ROBOT ASSISTED LAPAROSCOPIC RADICAL PROSTATECTOMY: SHX5141

## 2023-07-16 LAB — CBC
HCT: 42.9 % (ref 39.0–52.0)
Hemoglobin: 13.2 g/dL (ref 13.0–17.0)
MCH: 31.4 pg (ref 26.0–34.0)
MCHC: 30.8 g/dL (ref 30.0–36.0)
MCV: 102.1 fL — ABNORMAL HIGH (ref 80.0–100.0)
Platelets: 208 10*3/uL (ref 150–400)
RBC: 4.2 MIL/uL — ABNORMAL LOW (ref 4.22–5.81)
RDW: 14.7 % (ref 11.5–15.5)
WBC: 14.5 10*3/uL — ABNORMAL HIGH (ref 4.0–10.5)
nRBC: 0 % (ref 0.0–0.2)

## 2023-07-16 LAB — BASIC METABOLIC PANEL WITH GFR
Anion gap: 8 (ref 5–15)
BUN: 19 mg/dL (ref 8–23)
CO2: 21 mmol/L — ABNORMAL LOW (ref 22–32)
Calcium: 8.1 mg/dL — ABNORMAL LOW (ref 8.9–10.3)
Chloride: 109 mmol/L (ref 98–111)
Creatinine, Ser: 1.16 mg/dL (ref 0.61–1.24)
GFR, Estimated: >60 mL/min (ref >60)
Glucose, Bld: 128 mg/dL — ABNORMAL HIGH (ref 70–99)
Potassium: 4.4 mmol/L (ref 3.5–5.1)
Sodium: 138 mmol/L (ref 135–145)

## 2023-07-16 LAB — ABO/RH: ABO/RH(D): O POS

## 2023-07-16 SURGERY — PROSTATECTOMY, RADICAL, ROBOT-ASSISTED, LAPAROSCOPIC
Anesthesia: General

## 2023-07-16 MED ORDER — LIDOCAINE HCL (CARDIAC) PF 100 MG/5ML IV SOSY
PREFILLED_SYRINGE | INTRAVENOUS | Status: DC | PRN
  Administered 2023-07-16: 100 mg via INTRAVENOUS

## 2023-07-16 MED ORDER — HYDROMORPHONE HCL 2 MG/ML IJ SOLN
INTRAMUSCULAR | Status: AC
  Filled 2023-07-16: qty 1

## 2023-07-16 MED ORDER — OXYCODONE HCL 5 MG/5ML PO SOLN: 5.0000 mg | Freq: Once | ORAL | Status: DC | PRN

## 2023-07-16 MED ORDER — FENTANYL CITRATE (PF) 100 MCG/2ML IJ SOLN
INTRAMUSCULAR | Status: DC | PRN
  Administered 2023-07-16 (×2): 25 ug via INTRAVENOUS
  Administered 2023-07-16: 50 ug via INTRAVENOUS

## 2023-07-16 MED ORDER — ORAL CARE MOUTH RINSE: 15.0000 mL | Freq: Once | OROMUCOSAL | Status: AC

## 2023-07-16 MED ORDER — AMISULPRIDE (ANTIEMETIC) 5 MG/2ML IV SOLN: 10.0000 mg | Freq: Once | INTRAVENOUS | Status: DC | PRN

## 2023-07-16 MED ORDER — DEXAMETHASONE SODIUM PHOSPHATE 10 MG/ML IJ SOLN
INTRAMUSCULAR | Status: AC
  Filled 2023-07-16: qty 1

## 2023-07-16 MED ORDER — OXYCODONE HCL 5 MG PO TABS: 5.0000 mg | ORAL_TABLET | ORAL | Status: DC | PRN

## 2023-07-16 MED ORDER — PROPOFOL 10 MG/ML IV BOLUS
INTRAVENOUS | Status: AC
  Filled 2023-07-16: qty 20

## 2023-07-16 MED ORDER — ACETAMINOPHEN 500 MG PO TABS
ORAL_TABLET | ORAL | Status: AC
  Administered 2023-07-16: 1000 mg via ORAL
  Filled 2023-07-16: qty 2

## 2023-07-16 MED ORDER — BUPIVACAINE LIPOSOME 1.3 % IJ SUSP
INTRAMUSCULAR | Status: DC | PRN
  Administered 2023-07-16: 20 mL

## 2023-07-16 MED ORDER — ATORVASTATIN CALCIUM 10 MG PO TABS
10.0000 mg | ORAL_TABLET | Freq: Every evening | ORAL | Status: DC
Start: 2023-07-16 — End: 2023-07-17
  Administered 2023-07-16: 10 mg via ORAL
  Filled 2023-07-16: qty 1

## 2023-07-16 MED ORDER — MAGNESIUM CITRATE PO SOLN: 1.0000 | Freq: Once | ORAL | Status: DC

## 2023-07-16 MED ORDER — ROCURONIUM BROMIDE 10 MG/ML (PF) SYRINGE
PREFILLED_SYRINGE | INTRAVENOUS | Status: AC
  Filled 2023-07-16: qty 10

## 2023-07-16 MED ORDER — FENTANYL CITRATE (PF) 100 MCG/2ML IJ SOLN
INTRAMUSCULAR | Status: AC
  Filled 2023-07-16: qty 2

## 2023-07-16 MED ORDER — SODIUM CHLORIDE (PF) 0.9 % IJ SOLN
INTRAMUSCULAR | Status: AC
  Filled 2023-07-16: qty 20

## 2023-07-16 MED ORDER — EPHEDRINE SULFATE-NACL 50-0.9 MG/10ML-% IV SOSY
PREFILLED_SYRINGE | INTRAVENOUS | Status: DC | PRN
  Administered 2023-07-16: 10 mg via INTRAVENOUS

## 2023-07-16 MED ORDER — CEFAZOLIN SODIUM-DEXTROSE 2-4 GM/100ML-% IV SOLN
2.0000 g | INTRAVENOUS | Status: AC
  Administered 2023-07-16: 2 g via INTRAVENOUS

## 2023-07-16 MED ORDER — ONDANSETRON HCL 4 MG/2ML IJ SOLN: 4.0000 mg | INTRAMUSCULAR | Status: DC | PRN

## 2023-07-16 MED ORDER — SULFAMETHOXAZOLE-TRIMETHOPRIM 800-160 MG PO TABS: 1.0000 | ORAL_TABLET | Freq: Two times a day (BID) | ORAL | 0 refills | Status: AC

## 2023-07-16 MED ORDER — SODIUM CHLORIDE 0.9% FLUSH
INTRAVENOUS | Status: DC | PRN
  Administered 2023-07-16: 20 mL

## 2023-07-16 MED ORDER — PROPOFOL 1000 MG/100ML IV EMUL
INTRAVENOUS | Status: AC
  Filled 2023-07-16: qty 100

## 2023-07-16 MED ORDER — PROPOFOL 500 MG/50ML IV EMUL
INTRAVENOUS | Status: AC
  Filled 2023-07-16: qty 50

## 2023-07-16 MED ORDER — CHLORHEXIDINE GLUCONATE 0.12 % MT SOLN
15.0000 mL | Freq: Once | OROMUCOSAL | Status: AC
  Administered 2023-07-16: 15 mL via OROMUCOSAL

## 2023-07-16 MED ORDER — SODIUM CHLORIDE 0.9 % IV SOLN: INTRAVENOUS | Status: DC

## 2023-07-16 MED ORDER — ONDANSETRON HCL 4 MG/2ML IJ SOLN
INTRAMUSCULAR | Status: AC
  Filled 2023-07-16: qty 2

## 2023-07-16 MED ORDER — ACETAMINOPHEN 10 MG/ML IV SOLN
1000.0000 mg | Freq: Four times a day (QID) | INTRAVENOUS | Status: AC
  Administered 2023-07-16 – 2023-07-17 (×4): 1000 mg via INTRAVENOUS
  Filled 2023-07-16 (×4): qty 100

## 2023-07-16 MED ORDER — CHLORHEXIDINE GLUCONATE CLOTH 2 % EX PADS
6.0000 | MEDICATED_PAD | Freq: Every day | CUTANEOUS | Status: DC
Start: 2023-07-16 — End: 2023-07-17
  Administered 2023-07-16 – 2023-07-17 (×2): 6 via TOPICAL

## 2023-07-16 MED ORDER — HYDROCODONE-ACETAMINOPHEN 5-325 MG PO TABS: 1.0000 | ORAL_TABLET | Freq: Four times a day (QID) | ORAL | 0 refills | Status: DC | PRN

## 2023-07-16 MED ORDER — MIDAZOLAM HCL 2 MG/2ML IJ SOLN
INTRAMUSCULAR | Status: AC
  Filled 2023-07-16: qty 2

## 2023-07-16 MED ORDER — DEXMEDETOMIDINE HCL IN NACL 80 MCG/20ML IV SOLN
INTRAVENOUS | Status: AC
  Filled 2023-07-16: qty 20

## 2023-07-16 MED ORDER — POLYVINYL ALCOHOL 1.4 % OP SOLN: 1.0000 [drp] | Freq: Two times a day (BID) | OPHTHALMIC | Status: DC | PRN

## 2023-07-16 MED ORDER — LORATADINE 10 MG PO TABS
10.0000 mg | ORAL_TABLET | Freq: Every morning | ORAL | Status: DC
  Filled 2023-07-16: qty 1

## 2023-07-16 MED ORDER — DOCUSATE SODIUM 100 MG PO CAPS: 100.0000 mg | ORAL_CAPSULE | Freq: Two times a day (BID) | ORAL | Status: DC

## 2023-07-16 MED ORDER — SODIUM CHLORIDE 0.9 % IV BOLUS
1000.0000 mL | Freq: Once | INTRAVENOUS | Status: AC
  Administered 2023-07-16: 1000 mL via INTRAVENOUS

## 2023-07-16 MED ORDER — LIDOCAINE HCL (PF) 2 % IJ SOLN
INTRAMUSCULAR | Status: AC
  Filled 2023-07-16: qty 5

## 2023-07-16 MED ORDER — LACTATED RINGERS IV SOLN
INTRAVENOUS | Status: DC | PRN
Start: 2023-07-16 — End: 2023-07-16

## 2023-07-16 MED ORDER — DEXAMETHASONE SODIUM PHOSPHATE 10 MG/ML IJ SOLN
INTRAMUSCULAR | Status: DC | PRN
  Administered 2023-07-16: 10 mg via INTRAVENOUS

## 2023-07-16 MED ORDER — MIDAZOLAM HCL 5 MG/5ML IJ SOLN
INTRAMUSCULAR | Status: DC | PRN
Start: 2023-07-16 — End: 2023-07-16
  Administered 2023-07-16: 2 mg via INTRAVENOUS

## 2023-07-16 MED ORDER — CEFAZOLIN SODIUM-DEXTROSE 1-4 GM/50ML-% IV SOLN
1.0000 g | Freq: Three times a day (TID) | INTRAVENOUS | Status: AC
  Administered 2023-07-16 (×2): 1 g via INTRAVENOUS
  Filled 2023-07-16 (×2): qty 50

## 2023-07-16 MED ORDER — SODIUM CHLORIDE 0.9 % IV SOLN
Freq: Once | INTRAVENOUS | Status: AC
  Administered 2023-07-16: 1000 mL via INTRAVENOUS

## 2023-07-16 MED ORDER — CEFAZOLIN SODIUM-DEXTROSE 2-4 GM/100ML-% IV SOLN
INTRAVENOUS | Status: AC
  Filled 2023-07-16: qty 100

## 2023-07-16 MED ORDER — ROCURONIUM BROMIDE 100 MG/10ML IV SOLN
INTRAVENOUS | Status: DC | PRN
  Administered 2023-07-16 (×5): 20 mg via INTRAVENOUS
  Administered 2023-07-16: 60 mg via INTRAVENOUS
  Administered 2023-07-16: 20 mg via INTRAVENOUS

## 2023-07-16 MED ORDER — DOCUSATE SODIUM 100 MG PO CAPS
100.0000 mg | ORAL_CAPSULE | Freq: Two times a day (BID) | ORAL | Status: DC
Start: 2023-07-16 — End: 2023-07-17
  Administered 2023-07-16 – 2023-07-17 (×3): 100 mg via ORAL
  Filled 2023-07-16 (×3): qty 1

## 2023-07-16 MED ORDER — SUGAMMADEX SODIUM 200 MG/2ML IV SOLN
INTRAVENOUS | Status: DC | PRN
Start: 2023-07-16 — End: 2023-07-16
  Administered 2023-07-16: 200 mg via INTRAVENOUS

## 2023-07-16 MED ORDER — PROPOFOL 10 MG/ML IV BOLUS
INTRAVENOUS | Status: DC | PRN
  Administered 2023-07-16: 160 mg via INTRAVENOUS

## 2023-07-16 MED ORDER — BUPIVACAINE LIPOSOME 1.3 % IJ SUSP
INTRAMUSCULAR | Status: AC
  Filled 2023-07-16: qty 20

## 2023-07-16 MED ORDER — OXYCODONE HCL 5 MG PO TABS: 5.0000 mg | ORAL_TABLET | Freq: Once | ORAL | Status: DC | PRN

## 2023-07-16 MED ORDER — PROPOFOL 500 MG/50ML IV EMUL
INTRAVENOUS | Status: DC | PRN
  Administered 2023-07-16: 75 ug/kg/min via INTRAVENOUS

## 2023-07-16 MED ORDER — HYDROMORPHONE HCL 1 MG/ML IJ SOLN: 0.2500 mg | INTRAMUSCULAR | Status: DC | PRN

## 2023-07-16 MED ORDER — ONDANSETRON HCL 4 MG/2ML IJ SOLN
INTRAMUSCULAR | Status: DC | PRN
  Administered 2023-07-16: 4 mg via INTRAVENOUS

## 2023-07-16 MED ORDER — HYDROMORPHONE HCL 1 MG/ML IJ SOLN
INTRAMUSCULAR | Status: DC | PRN
  Administered 2023-07-16: .5 mg via INTRAVENOUS
  Administered 2023-07-16: 1 mg via INTRAVENOUS

## 2023-07-16 MED ORDER — FLEET ENEMA RE ENEM: 1.0000 | ENEMA | Freq: Once | RECTAL | Status: DC

## 2023-07-16 MED ORDER — ACETAMINOPHEN 500 MG PO TABS: 1000.0000 mg | ORAL_TABLET | Freq: Once | ORAL | Status: AC

## 2023-07-16 SURGICAL SUPPLY — 64 items
APPLICATOR COTTON TIP 6 STRL (MISCELLANEOUS) ×2 IMPLANT
APPLICATOR COTTON TIP 6IN STRL (MISCELLANEOUS) ×2 IMPLANT
APPLICATOR SURGIFLO ENDO (HEMOSTASIS) IMPLANT
BAG COUNTER SPONGE SURGICOUNT (BAG) IMPLANT
CATH FOLEY 2WAY SLVR 5CC 18FR (CATHETERS) ×2 IMPLANT
CATH ROBINSON RED A/P 16FR (CATHETERS) ×2 IMPLANT
CATH SILICONE 5CC 18FR (INSTRUMENTS) ×2 IMPLANT
CHLORAPREP W/TINT 26 (MISCELLANEOUS) ×2 IMPLANT
CLIP LIGATING HEM O LOK PURPLE (MISCELLANEOUS) ×2 IMPLANT
COVER SURGICAL LIGHT HANDLE (MISCELLANEOUS) ×2 IMPLANT
COVER TIP SHEARS 8 DVNC (MISCELLANEOUS) ×2 IMPLANT
CUTTER ECHEON FLEX ENDO 45 340 (ENDOMECHANICALS) IMPLANT
DERMABOND ADVANCED .7 DNX12 (GAUZE/BANDAGES/DRESSINGS) ×2 IMPLANT
DRAPE ARM DVNC X/XI (DISPOSABLE) ×8 IMPLANT
DRAPE COLUMN DVNC XI (DISPOSABLE) ×2 IMPLANT
DRAPE SURG IRRIG POUCH 19X23 (DRAPES) ×2 IMPLANT
DRIVER NDL LRG 8 DVNC XI (INSTRUMENTS) ×4 IMPLANT
DRIVER NDLE LRG 8 DVNC XI (INSTRUMENTS) ×4 IMPLANT
DRSG TEGADERM 4X4.75 (GAUZE/BANDAGES/DRESSINGS) IMPLANT
ELECT PENCIL ROCKER SW 15FT (MISCELLANEOUS) ×2 IMPLANT
ELECT REM PT RETURN 15FT ADLT (MISCELLANEOUS) ×2 IMPLANT
FORCEPS BPLR 8 MD DVNC XI (FORCEP) ×2 IMPLANT
FORCEPS BPLR FENES DVNC XI (FORCEP) ×2 IMPLANT
FORCEPS PROGRASP DVNC XI (FORCEP) ×2 IMPLANT
GAUZE 4X4 16PLY ~~LOC~~+RFID DBL (SPONGE) IMPLANT
GAUZE SPONGE 4X4 12PLY STRL (GAUZE/BANDAGES/DRESSINGS) ×2 IMPLANT
GLOVE BIO SURGEON STRL SZ 6.5 (GLOVE) ×2 IMPLANT
GLOVE BIOGEL M 7.0 STRL (GLOVE) ×4 IMPLANT
GLOVE BIOGEL PI IND STRL 7.5 (GLOVE) ×4 IMPLANT
GOWN STRL REUS W/ TWL XL LVL3 (GOWN DISPOSABLE) ×4 IMPLANT
GOWN STRL SURGICAL XL XLNG (GOWN DISPOSABLE) ×2 IMPLANT
HEMOSTAT SURGICEL 4X8 (HEMOSTASIS) IMPLANT
HOLDER FOLEY CATH W/STRAP (MISCELLANEOUS) ×2 IMPLANT
IRRIG SUCT STRYKERFLOW 2 WTIP (MISCELLANEOUS) ×2 IMPLANT
IRRIGATION SUCT STRKRFLW 2 WTP (MISCELLANEOUS) ×2 IMPLANT
IV LACTATED RINGERS 1000ML (IV SOLUTION) ×2 IMPLANT
KIT TURNOVER KIT A (KITS) IMPLANT
MARKER SKIN DUAL TIP RULER LAB (MISCELLANEOUS) ×2 IMPLANT
NDL INSUFFLATION 14GA 120MM (NEEDLE) ×2 IMPLANT
NEEDLE INSUFFLATION 14GA 120MM (NEEDLE) ×2 IMPLANT
PACK ROBOT UROLOGY CUSTOM (CUSTOM PROCEDURE TRAY) ×2 IMPLANT
PLUG CATH AND CAP STRL 200 (CATHETERS) IMPLANT
PROTECTOR NERVE ULNAR (MISCELLANEOUS) ×2 IMPLANT
RELOAD STAPLE 45 4.1 GRN THCK (STAPLE) IMPLANT
SCISSORS MNPLR CVD DVNC XI (INSTRUMENTS) ×2 IMPLANT
SEAL UNIV 5-12 XI (MISCELLANEOUS) ×8 IMPLANT
SET CYSTO W/LG BORE CLAMP LF (SET/KITS/TRAYS/PACK) IMPLANT
SET TUBE SMOKE EVAC HIGH FLOW (TUBING) ×2 IMPLANT
SOL ELECTROSURG ANTI STICK (MISCELLANEOUS) ×2 IMPLANT
SOL PREP POV-IOD 4OZ 10% (MISCELLANEOUS) ×2 IMPLANT
SOLUTION ELECTROSURG ANTI STCK (MISCELLANEOUS) ×2 IMPLANT
STAPLE RELOAD 45 GRN (STAPLE) ×2 IMPLANT
SURGIFLO W/THROMBIN 8M KIT (HEMOSTASIS) IMPLANT
SUT ETHILON 2 0 PS N (SUTURE) IMPLANT
SUT MNCRL 3 0 VIOLET RB1 (SUTURE) IMPLANT
SUT MNCRL AB 4-0 PS2 18 (SUTURE) ×4 IMPLANT
SUT PDS AB 0 CT1 36 (SUTURE) ×4 IMPLANT
SUT VIC AB 0 CT1 27XBRD ANTBC (SUTURE) ×2 IMPLANT
SUT VIC AB 2-0 SH 27XBRD (SUTURE) ×2 IMPLANT
SUT VIC AB 3-0 SH 27X BRD (SUTURE) IMPLANT
SUT VIC AB 4-0 RB1 27XBRD (SUTURE) IMPLANT
SUT VLOC BARB 180 ABS3/0GR12 (SUTURE) ×8 IMPLANT
SUTURE VLOC BRB 180 ABS3/0GR12 (SUTURE) IMPLANT
WATER STERILE IRR 1000ML POUR (IV SOLUTION) ×2 IMPLANT

## 2023-07-16 NOTE — H&P (Signed)
 Office Visit Report     06/19/2023   --------------------------------------------------------------------------------   Martin Malone  MRN: [id number]  DOB: [birth year], 68 year old Male  SSN:    PRIMARY CARE:  Helane Rima. Beverely Low, MD  PRIMARY CARE FAX:  [phone]  REFERRING:  Jannifer Hick, MD  PROVIDER:  Jettie Pagan, M.D.  TREATING:  [place], [place]  LOCATION:  [facility] [phone]     --------------------------------------------------------------------------------   CC/HPI: Pt presents today for pre-operative history and physical exam in anticipation of robotic assisted lap radical prostatectomy with bilateral pelvic lymph node dissection by Dr. Cardell Peach on 07/16/23. He is doing well and is without complaint.   Pt denies F/C, HA, CP, SOB, N/V, diarrhea/constipation, back pain, flank pain, hematuria, and dysuria.     HX:   Martin Malone is a 68 year old male who seen to discuss his new diagnosis of prostate cancer and definitive treatment options.   1. Localized unfavorable intermediate risk prostate cancer:  -Found to have rising PSA of 8.43 in 01/2023 and firm nodule on right in 01/2023  Prostate MRI 04/01/2023 with PI-RADS category 4 lesion of right anterior peripheral zone measuring 1 cm. Prostate volume 28 cc. No extraprostatic involvement.  Patient underwent MRI fusion prostate biopsy on 05/02/2023 for an elevated PSA of 8.43 ng/mL. Biopsy revealed GS 4+3 = 7 and 3/4 cores at the ROI and 3 separate cores of GS 4+3 = 7 adenocarcinoma of the prostate with 6/16 total cores positive (40-80%), TRUS volume of 29 cm3. Denies new or worsening bone or back pain. Good appetite and stable weight.   Family history: Denies  Imaging studies:  Prostate MRI 04/01/2023 with PI-RADS category 4 lesion of anterior peripheral zone of the prostate. Prostate volume 28 cc. No extraprostatic involvement.   PMH: GERD, hyperlipidemia. He takes aspirin 81 mg daily.   PSH: He denies prior abdominal surgeries.   TNM stage: [id number]  PSA: 8.43  Gleason score: 4+3 = 7  Biopsy: 05/02/2023  Left: 1 core atypia in 1 core HGPIN  Right: 4+3 = 7 in 3/4 cores at the ROI in the anterior peripheral zone of the prostate. Also 3 cores 4+3 = 7 at the right apex, right mid, right lateral mid  Prostate volume: 28 cc  PSAD: 0.29   Nomogram  CSS (15-year): 93%  PFS (5 year, 10 year): 58%, 41%  EPE: 60%  LNI: 14%  SVI: 11%   IPSS: Denies bothersome LUTS.  SHIM: He did not complete this form.     ALLERGIES: None   MEDICATIONS: Ambien 10 mg tablet  Aspirin Ec 81 mg tablet, delayed release  Atorvastatin Calcium 10 mg tablet  Glucosamine-Chondroitin  Ibuprofen  Meloxicam  Multivitamin  Tumeric  Vitamin B Complex  Vitamin D2     GU PSH: Prostate Needle Biopsy - 05/02/2023     NON-GU PSH: Knee replacement - 2019 Surgical Pathology, Gross And Microscopic Examination For Prostate Needle - 05/02/2023 Visit Complexity (formerly GPC1X) - 05/09/2023, 02/14/2023     GU PMH: Stress Incontinence - 05/28/2023 Prostate Cancer - 05/24/2023, - 05/09/2023 BPH w/o LUTS - 05/09/2023, - 05/02/2023, - 02/14/2023, - 08/11/2022, - 05/12/2022, - 09/05/2021 Elevated PSA - 05/02/2023, - 02/14/2023, - 08/11/2022, - 05/12/2022, - 09/05/2021, - 03/07/2021      PMH Notes: HLD   NON-GU PMH: Muscle weakness (generalized) - 05/28/2023, - 05/24/2023 Other muscle spasm - 05/28/2023 GERD    FAMILY HISTORY: None   SOCIAL HISTORY: Marital Status: Married Ethnicity: Not  Hispanic Or Latino; Race: White Current Smoking Status: Patient does not smoke anymore. Has not smoked since 02/15/1981. Smoked for 3 years. Smoked 1 pack per day.   Tobacco Use Assessment Completed: Used Tobacco in last 30 days? Does not use smokeless tobacco. Has never drank.  Does not use drugs. Drinks 4+ caffeinated drinks per day. Has not had a blood transfusion.    REVIEW OF SYSTEMS:    GU Review Male:   Patient  denies frequent urination, hard to postpone urination, burning/ pain with urination, get up at night to urinate, leakage of urine, stream starts and stops, trouble starting your stream, have to strain to urinate , erection problems, and penile pain.  Gastrointestinal (Upper):   Patient denies nausea, vomiting, and indigestion/ heartburn.  Gastrointestinal (Lower):   Patient denies diarrhea and constipation.  Constitutional:   Patient denies fever, night sweats, weight loss, and fatigue.  Skin:   Patient denies skin rash/ lesion and itching.  Eyes:   Patient denies blurred vision and double vision.  Ears/ Nose/ Throat:   Patient denies sore throat and sinus problems.  Hematologic/Lymphatic:   Patient denies swollen glands and easy bruising.  Cardiovascular:   Patient denies leg swelling and chest pains.  Respiratory:   Patient denies cough and shortness of breath.  Endocrine:   Patient denies excessive thirst.  Musculoskeletal:   Patient denies back pain and joint pain.  Neurological:   Patient denies headaches and dizziness.  Psychologic:   Patient denies depression and anxiety.   VITAL SIGNS:      06/19/2023 01:29 PM  BP 146/81 mmHg  Pulse 63 /min  Temperature 97.3 F / 36.2 C   MULTI-SYSTEM PHYSICAL EXAMINATION:    Constitutional: Well-nourished. No physical deformities. Normally developed. Good grooming.  Neck: Neck symmetrical, not swollen. Normal tracheal position.  Respiratory: Normal breath sounds. No labored breathing, no use of accessory muscles.   Cardiovascular: Regular rate and rhythm. No murmur, no gallop.   Lymphatic: No enlargement of neck, axillae, groin.  Skin: No paleness, no jaundice, no cyanosis. No lesion, no ulcer, no rash.  Neurologic / Psychiatric: Oriented to time, oriented to place, oriented to person. No depression, no anxiety, no agitation.  Gastrointestinal: No mass, no tenderness, no rigidity, non obese abdomen.  Eyes: Normal conjunctivae. Normal eyelids.   Ears, Nose, Mouth, and Throat: Left ear no scars, no lesions, no masses. Right ear no scars, no lesions, no masses. Nose no scars, no lesions, no masses. Normal hearing. Normal lips.  Musculoskeletal: Normal gait and station of head and neck.     Complexity of Data:  Records Review:   Previous Patient Records  Urine Test Review:   Urinalysis   06/19/23  Urinalysis  Urine Appearance Clear   Urine Color Yellow   Urine Glucose Neg mg/dL  Urine Bilirubin Neg mg/dL  Urine Ketones Neg mg/dL  Urine Specific Gravity 1.015   Urine Blood Neg ery/uL  Urine pH <=5.0   Urine Protein Neg mg/dL  Urine Urobilinogen 0.2 mg/dL  Urine Nitrites Neg   Urine Leukocyte Esterase Neg leu/uL   PROCEDURES:          Urinalysis - 81003 Dipstick Dipstick Cont'd  Color: Yellow Bilirubin: Neg mg/dL  Appearance: Clear Ketones: Neg mg/dL  Specific Gravity: 9.147 Blood: Neg ery/uL  pH: <=5.0 Protein: Neg mg/dL  Glucose: Neg mg/dL Urobilinogen: 0.2 mg/dL    Nitrites: Neg    Leukocyte Esterase: Neg leu/uL    ASSESSMENT:      ICD-10  Details  1 GU:   Prostate Cancer - C61    PLAN:           Schedule Return Visit/Planned Activity: Keep Scheduled Appointment - Schedule Surgery          Document Letter(s):  Created for Patient: Clinical Summary         Notes:   There are no changes in the patients history or physical exam since last evaluation by Dr. Cardell Peach. Pt is scheduled to undergo RALP with BPLND on 07/16/23.   All pt's questions were answered to the best of my ability.          Next Appointment:      Next Appointment: 07/16/2023 07:30 AM    Appointment Type: Surgery     Location: [facility] [phone]    Provider: Jettie Pagan, M.D.    Reason for Visit: WL/OBS RA LAP RAD PROSTATECTOMY AND BPLND WITH [facility]     Urology Preoperative H&P   Chief Complaint: Prostate cancer  History of Present Illness: Martin Malone is a 68 y.o. male with prostate cancer here for RALP  with b/l PLND. Denies fevers, chills, dysuria.    Past Medical History:  Diagnosis Date   Cancer [facility])    prostate   GERD (gastroesophageal reflux disease)    Hx of no issues   Hyperlipidemia    Rib fracture     Past Surgical History:  Procedure Laterality Date   TOTAL KNEE ARTHROPLASTY Left 04/17/2004    Allergies: No Known Allergies  Family History  Problem Relation Age of Onset   Coronary artery disease Brother    Dementia Mother    Pancreatitis Father     Social History:  reports that he quit smoking about 46 years ago. His smoking use included cigarettes. He has never used smokeless tobacco. He reports that he does not currently use alcohol after a past usage of about 2.0 standard drinks of alcohol per week. He reports that he does not use drugs.  ROS: A complete review of systems was performed.  All systems are negative except for pertinent findings as noted.  Physical Exam:  Vital signs in last 24 hours: Temp:  [97.5 F (36.4 C)] 97.5 F (36.4 C) (03/31 0610) Pulse Rate:  [78] 78 (03/31 0610) Resp:  [16] 16 (03/31 0610) BP: (119)/(77) 119/77 (03/31 0610) SpO2:  [95 %] 95 % (03/31 0610) Weight:  [77.6 kg] 77.6 kg (03/31 0610) Constitutional:  Alert and oriented, No acute distress Cardiovascular: Regular rate and rhythm Respiratory: Normal respiratory effort, Lungs clear bilaterally GI: Abdomen is soft, nontender, nondistended, no abdominal masses GU: No CVA tenderness Lymphatic: No lymphadenopathy Neurologic: Grossly intact, no focal deficits Psychiatric: Normal mood and affect  Laboratory Data:  No results for input(s): "WBC", "HGB", "HCT", "PLT" in the last 72 hours.  No results for input(s): "NA", "K", "CL", "GLUCOSE", "BUN", "CALCIUM", "CREATININE" in the last 72 hours.  Invalid input(s): "CO3"   Results for orders placed or performed during the hospital encounter of 07/16/23 (from the past 24 hours)  ABO/Rh     Status: None   Collection Time:  07/16/23  6:25 AM  Result Value Ref Range   ABO/RH(D)      O POS Performed at [facility], [address], [place], [place] [address]    No results found for this or any previous visit (from the past 240 hours).  Renal Function: No results for input(s): "CREATININE" in the last  168 hours. Estimated Creatinine Clearance: 65.8 mL/min (by C-G formula based on SCr of 1.11 mg/dL).  Radiologic Imaging: No results found.  I independently reviewed the above imaging studies.  Assessment and Plan Martin Malone is a 68 y.o. male with prostate cancer here for RALP with b/l PLND.    Matt R. Aine Strycharz MD 07/16/2023, 7:22 AM  [facility] Pager: [phone]): [phone]

## 2023-07-16 NOTE — Transfer of Care (Signed)
 Immediate Anesthesia Transfer of Care Note  Patient: HALFORD GOETZKE  Procedure(s) Performed: PROSTATECTOMY, RADICAL, ROBOT-ASSISTED, LAPAROSCOPIC BILATERAL PELVIC LYMPHADENECTOMY (Bilateral)  Patient Location: PACU  Anesthesia Type:General  Level of Consciousness: drowsy  Airway & Oxygen Therapy: Patient Spontanous Breathing and Patient connected to face mask oxygen  Post-op Assessment: Report given to RN and Post -op Vital signs reviewed and unstable, Anesthesiologist notified  Post vital signs: Reviewed and stable  Last Vitals:  Vitals Value Taken Time  BP 138/75 07/16/23 1200  Temp    Pulse 63 07/16/23 1202  Resp 13 07/16/23 1202  SpO2 100 % 07/16/23 1202  Vitals shown include unfiled device data.  Last Pain:  Vitals:   07/16/23 0610  TempSrc: Oral  PainSc: 0-No pain         Complications: No notable events documented.

## 2023-07-16 NOTE — Discharge Instructions (Signed)

## 2023-07-16 NOTE — Anesthesia Procedure Notes (Signed)
 Procedure Name: Intubation Date/Time: 07/16/2023 7:34 AM  Performed by: Maurene Capes, CRNAPre-anesthesia Checklist: Patient identified, Emergency Drugs available, Suction available and Patient being monitored Patient Re-evaluated:Patient Re-evaluated prior to induction Oxygen Delivery Method: Circle System Utilized Preoxygenation: Pre-oxygenation with 100% oxygen Induction Type: IV induction Ventilation: Mask ventilation without difficulty Grade View: Grade II Tube type: Oral Number of attempts: 1 Placement Confirmation: ETT inserted through vocal cords under direct vision, positive ETCO2 and breath sounds checked- equal and bilateral Secured at: 23 cm Tube secured with: Tape Dental Injury: Teeth and Oropharynx as per pre-operative assessment

## 2023-07-16 NOTE — Anesthesia Postprocedure Evaluation (Signed)
 Anesthesia Post Note  Patient: Martin Malone  Procedure(s) Performed: PROSTATECTOMY, RADICAL, ROBOT-ASSISTED, LAPAROSCOPIC BILATERAL PELVIC LYMPHADENECTOMY (Bilateral)     Patient location during evaluation: PACU Anesthesia Type: General Level of consciousness: awake Pain management: pain level controlled Vital Signs Assessment: post-procedure vital signs reviewed and stable Respiratory status: spontaneous breathing, nonlabored ventilation and respiratory function stable Cardiovascular status: blood pressure returned to baseline and stable Postop Assessment: no apparent nausea or vomiting Anesthetic complications: no   No notable events documented.  Last Vitals:  Vitals:   07/16/23 1245 07/16/23 1313  BP: (!) 142/69 138/71  Pulse: (!) 55 (!) 58  Resp: 12 13  Temp:  36.4 C  SpO2: 100% 98%    Last Pain:  Vitals:   07/16/23 1313  TempSrc: Oral  PainSc:                  [name]

## 2023-07-16 NOTE — Discharge Summary (Shared)
 Date of admission: 07/16/2023  Date of discharge: 07/17/2023  Admission diagnosis: Prostate cancer  Discharge diagnosis: same  Secondary diagnoses:  Patient Active Problem List   Diagnosis Date Noted   S/P prostatectomy 07/16/2023   Elevated PSA 04/18/2022   Insomnia 10/05/2021   History of malignant neoplasm of skin 09/01/2021   Other seborrheic keratosis 09/01/2021   Melanocytic nevi of trunk 09/01/2021   Plantar fasciitis of right foot 01/02/2019   Left lateral epicondylitis 02/13/2016   Lateral epicondylitis of right elbow 02/06/2014   History of meniscal tear 07/15/2013   BPV (benign positional vertigo) 05/28/2013   Routine general medical examination at a health care facility 11/19/2012   Hyperlipidemia 05/22/2012   Family history of premature CAD 11/29/2011    Procedures performed: Procedure(s): PROSTATECTOMY, RADICAL, ROBOT-ASSISTED, LAPAROSCOPIC BILATERAL PELVIC LYMPHADENECTOMY  History and Physical: For full details, please see admission history and physical. Briefly, Martin Malone is a 68 y.o. year old patient with prostate cancer who underwent a robotic prostatectomy with bilateral lymph node dissection on 3/31.   Hospital Course: Patient tolerated the procedure well.  He was then transferred to the floor after an uneventful PACU stay.  His hospital course was uncomplicated.  On POD#1 he had met discharge criteria: was eating a regular diet, was up and ambulating independently,  pain was well controlled, was voiding via catheter, and was ready to for discharge.   Laboratory values:  Recent Labs    07/16/23 1246 07/17/23 0357  WBC 14.5* 11.4*  HGB 13.2 11.5*  HCT 42.9 36.4*   Recent Labs    07/16/23 1246 07/17/23 0357  NA 138 137  K 4.4 3.8  CL 109 110  CO2 21* 22  GLUCOSE 128* 109*  BUN 19 13  CREATININE 1.16 1.01  CALCIUM 8.1* 7.8*   No results for input(s): "LABPT", "INR" in the last 72 hours. No results for input(s): "LABURIN" in the last 72  hours. Results for orders placed or performed in visit on 03/01/19  Novel Coronavirus, NAA ([facility])     Status: None   Collection Time: 03/01/19 12:15 PM   Specimen: Nasopharyngeal(NP) swabs in vial transport medium   NASOPHARYNGE  SCREENIN  Result Value Ref Range Status   SARS-CoV-2, NAA Not Detected Not Detected Final    Comment: Testing was performed using the Aptima SARS-CoV-2 assay. This nucleic acid amplification test was developed and its performance characteristics determined by [employer]. Nucleic acid amplification tests include PCR and TMA. This test has not been FDA cleared or approved. This test has been authorized by FDA under an Emergency Use Authorization (EUA). This test is only authorized for the duration of time the declaration that circumstances exist justifying the authorization of the emergency use of in vitro diagnostic tests for detection of SARS-CoV-2 virus and/or diagnosis of COVID-19 infection under section 564(b)(1) of the Act, 21 U.S.C. 782NFA-2(Z) (1), unless the authorization is terminated or revoked sooner. When diagnostic testing is negative, the possibility of a false negative result should be considered in the context of a patient's recent exposures and the presence of clinical signs and symptoms consistent with COVID-19. An individual without symptoms o f COVID-19 and who is not shedding SARS-CoV-2 virus would expect to have a negative (not detected) result in this assay.     Physical Exam  Gen: NAD Resp: Satting well on RA Card: Regular rate Abd: Soft, appropriately tender, ND, incision clean dry and intact GU: Foley catheter in place draining urine. Neuro: Alert  Disposition: Home  Discharge instruction: The patient was instructed to be ambulatory but told to refrain from heavy lifting, strenuous activity, or driving.   Discharge medications:  Allergies as of 07/17/2023   No Known Allergies      Medication List     STOP  taking these medications    aspirin EC 81 MG tablet   diphenhydramine-acetaminophen 25-500 MG Tabs tablet Commonly known as: TYLENOL PM   GLUCOSAMINE-CHONDROITIN PO   KRILL OIL PO   meloxicam 15 MG tablet Commonly known as: MOBIC   multivitamin with minerals Tabs tablet   naproxen sodium 220 MG tablet Commonly known as: ALEVE       TAKE these medications    acetaminophen 500 MG tablet Commonly known as: TYLENOL Take 500-1,000 mg by mouth every 6 (six) hours as needed (pain.).   atorvastatin 10 MG tablet Commonly known as: LIPITOR TAKE ONE TABLET BY MOUTH EVERY NIGHT AT BEDTIME   docusate sodium 100 MG capsule Commonly known as: Colace Take 1 capsule (100 mg total) by mouth 2 (two) times daily.   HYDROcodone-acetaminophen 5-325 MG tablet Commonly known as: NORCO/VICODIN Take 1-2 tablets by mouth every 6 (six) hours as needed for moderate pain (pain score 4-6) or severe pain (pain score 7-10).   loratadine 10 MG tablet Commonly known as: CLARITIN Take 10 mg by mouth in the morning.   Lumify 0.025 % Soln Generic drug: Brimonidine Tartrate Place 1 drop into both eyes 2 (two) times daily as needed (dry/red/irritated eyes.).   Mucus-DM Max 60-1200 MG Tb12 Take 1 tablet by mouth daily as needed (congestion).   sulfamethoxazole-trimethoprim 800-160 MG tablet Commonly known as: BACTRIM DS Take 1 tablet by mouth 2 (two) times daily for 3 days. Please start the day before catheter removal appointment and continue on the day of and day after.   zolpidem 10 MG tablet Commonly known as: AMBIEN TAKE ONE TABLET BY MOUTH EVERY NIGHT AT BEDTIME AS NEEDED FOR SLEEP        Followup:   Follow-up Information     Jannifer Hick, MD Follow up on 07/23/2023.   Specialty: Urology Why: at 8:30 Contact information: [address] [place] [address] [phone]

## 2023-07-16 NOTE — Op Note (Signed)
 Operative Note  Preoperative diagnosis:  1.  Localized prostate cancer  Postoperative diagnosis: 1.  Localized prostate cancer  Procedure(s): 1.  Robotic assisted laparoscopic radical prostatectomy (partial left nerve sparing) 2.  Robotic assisted laparoscopic bilateral pelvic lymph node dissection  Surgeon: Jettie Pagan, MD  Assistants:   Harrie Foreman, PA  An assistant was required for this surgical procedure.  The duties of the assistant included but were not limited to suctioning, passing suture, camera manipulation, retraction.  This procedure would not be able to be performed without an Geophysicist/field seismologist.   Resident: [place], PGY-4  Anesthesia:  General  Complications:  None  EBL:   Specimens: 1.  Prostate with seminal vesicles 2.  Periprostatic fat 3. Bilateral pelvic lymph nodes  Drains/Catheters: 1.  18 French Foley catheter  Intraoperative findings:   Approximately 30cc prostate.  No accessory pudendal vessels.  Successful left sided partial nerve spare.  Excellent hemostasis.  Water-tight anastomosis without leak.  Indication:  Martin Malone is a 68 y.o. malewho initially presented with an elevated PSA.  Prostate biopsy showed Gleason 4+3 prostate cancer involving the left side of the prostate.  Treatment options were discussed with him at length and he chose robotic assisted laparoscopic radical prostatectomy.  The plan is for left partial nerve sparing.  Bilateral pelvic lymph node dissection was planned due to his risk stratification.  Description of procedure: The indications, alternatives, benefits, and risks were discussed with the patient and informed symptoms obtained.  The patient was brought to the operating room table, positioned supine and secured to the bed with a safety strap.  All pressure points were carefully padded and pneumatic compression devices were placed on lower extremities.  After the administration of intravenous antibiotics  and general endotracheal anesthesia, the patient was repositioned in the dorsal lithotomy position using well-padded Allen stirrups.  The arms were carefully tucked at the patient's side and secured with padding.  The chest was secured in place with foam padding and cloth tape and the table was positioned in approximately 30 degree Trendelenburg.  A rectal exam showed the prostate to be 30cc, with nodule on the right and not fixed. The patient's abdomen, genitalia, and upper thighs were prepped and draped in the standard sterile manner.  A time was completed, verifying the correct patient, surgical procedure and positioning prior to beginning the procedure.  An 28 French urethral catheter was inserted to drain the bladder.  Pneumoperitoneum was introduced by placing a Veress needle into the abdomen superior to the umbilicus and insufflated with CO2 to a pressure of 15 mmHg. An 8 mm blunt tip trocar was placed just above the umbilicus.  The 0 degree camera was then passed under direct visualization.  The abdominal cavity was examined for any sign of injury, adhesions, and identification of anatomic landmarks.  The remainder of the trochars were placed which included 2 separate 8 millimeter robotic trochars which were placed 9 cm laterally and inferiorly to the initially placed camera trocar.  A 12 mm trocar was placed 8 mm lateral to the right robotic trocar.  A separate 8 mm robotic trocar was placed 8 cm lateral to the previously placed left robotic trocar on the left side.  A 5 mm trocar was placed to the right and well above the umbilicus which approximately 10 and 12 cm away from the right-sided trochars.  The robot was then docked.  I placed monopolar scissors in the right hand, a fenestrated bipolar in the left hand  and a prograsp in the fourth arm.  The urachus and median umbilical ligament was divided and we developed the space of Retzius down to the pubic bone.  I divided the parietal peritoneum  laterally up to the vas deferens on each side.  Using the prograsp forcep to provide cranial traction on the urachus, the prostate was then defatted above the prostatic vesicle junction, and the superficial dorsal venous complex was coagulated with bipolar and divided.  We submitted the periprostatic fat for pathological specimen.  The endopelvic fascia was sharply opened bilaterally and the levator muscle fibers were swept posterior laterally allowing for visualization of the deep dorsal venous complex and apex of the prostate.  The puboprostatic ligaments were sharply divided and care was taken to preserve the dorsal venous complex.  I secured the dorsal venous complex with an 0 Vicryl CT1 needle in a figure-of-eight fashion.  I then addressed the bladder neck with a 30 degree down lens. I identified the bladder neck by pulling a Foley catheter.  The fourth arm was applying cranial traction on the urachus.  I divided the anterior bladder neck musculature until I found the anterior bladder neck mucosa which was then incised. I identified the Foley catheter within, the balloon was deflated, we pulled the Foley catheter out into the operating field.  The assistant then used a grasper to apply traction to the Foley catheter and the surgical tech then placed a [place] on the catheter near the penis for optimal retraction.  I then divided the lateral bladder neck mucosa in the posterior bladder neck mucosa.  I was well away from the bilateral ureteral orifices.  I divided the posterior bladder neck musculature until I discovered the longitudinal fibers and kept dissecting until I found the vas deferens.  The bilateral vas deferens were then freed and divided.  I then freed the bilateral seminal vesicles using blunt and sharp dissection.  I placed metal clips on the seminal vesicle vessels and avoided cautery around the seminal vesicles.  I then switched back to a 0 degree lens.  I divided the Denonvilliers fascia  beneath the prostate and developed the prostate off the rectum. Of note, this plane was difficult and the prostate was adhered posteriorly.  This plane was bluntly developed towards the apex of the prostate.  I then addressed the pedicles, first starting with the right and then moving towards the left. I then did a left partial nerve spare by dividing the lateral pelvic fascia off of the prostatic capsule laterally.  I then isolated the pedicles of the prostate and placed Weck clips on the pedicles and then divided the pedicles with cold scissors.  I continued to divide the neurovascular bundles off of the prostate out to the apex of the prostate.  At this point the prostate was essentially freed up except for the urethra.   I then addressed the prostate anteriorly, dividing the dorsal vein with cautery.  The anterior urethra was then sharply divided with cold scissors.  The Foley catheter was then pulled back and we divided the posterior urethral wall. Patent venous sinuses were then oversewn with a 4-0 Vicryl suture.  The specimen was then placed in a Endo Catch bag and then the bag was placed in the upper abdomen out of the way.  I then irrigated the pelvis.  We performed a rectal test by instilling air into the rectal Foley.  The test was negative.  There is no concern for rectal injury.  I  then over sewed a few bleeders alongside the pedicles with a 4-0 Vicryl suture on an RB1 needle.  I then performed a bilateral pelvic lymph node dissection by incising the fascia overlying the right external iliac vein, dissecting distally.  I went just distal to the node of [place]et were replaced clips and then divided the lymphatics.  The lateral aspect of the dissection was the pelvic sidewall, inferior was the obturator nerve and proximal of the hypogastric vessels.  I placed clips at the proximal aspect and then divided the lymphatics.  The specimen was removed with the scope grasper and sent to pathology.  Grossly,  there were no enlarged lymph nodes.  This was performed on both the right and left pelvic lymph nodes.  With good hemostasis confirmed, I then did the posterior reconstruction with a Rocco stitch.  I used a 3-0 Vloc double-armed suture on an RB1 needle.  I passed the sutures through the cut edges of Denonvilliers fascia beneath the bladder on the right side and through the posterior serrated sphincter underneath the urethra.  I ran this from right to left.  And then took the other end of the suture passing just proximal to the posterior bladder neck in the midline into the posterior serrated sphincter and around this from right to left using 3 throws and reapproximated the sutures.  I then completed the urethrovesical anastomosis using a 3-0 Vloc suture on an RB1 needle.  I passed both ends of the suture from the outside in through the bladder neck at the 6 o'clock position.  I passed both through the urethral stump from the inside out and the corresponding position.  I reapproximated the bladder neck to the urethra.  I then ran the left suture on the left side anastomosis to the 9 o'clock position.  And then went back to the right-sided suture around that up the right side of the 12 o'clock position.  I then continued the left suture to the 12 o'clock position. I identified the ureteral orifices and ensured that these were not incorporated with the sutures.  I then placed a new 18 French Foley catheter into the bladder and filled it with 10 cc of sterile water.  I then secured the knot and then passed the suture behind the pubic bone for anterior suspension.  The bladder was irrigated with 200 cc of water.  There was no leak.  Hemostasis was excellent.  A 15 French drain was placed through the previously placed fourth arm.  We did place a Carter-Thomason 0 vicryl suture through the 12 mm assistant port. The robot was undocked and all the trochars were removed under direct vision.    I enlarged the umbilical  trocar site large enough to remove the prostate and closed the fascia with 0 PDS sutures in a running fashion.  All the port sites were irrigated.  Exparel was injected to the trocar sites.  The skin was closed with 4-0 Monocryl in a running subcuticular fashion.  Skin glue was applied.  At this point, the patient was extubated and awakened in the operating room and taken to recovery room in stable condition.  There were no immediate complications.  All counts were correct.  Plan: Admit for observation overnight.  Clear liquids tonight.  Regular for breakfast tomorrow.  Anticipate discharge home tomorrow.  Matt R. Zymir Napoli MD [facility]  Pager: [phone]

## 2023-07-17 ENCOUNTER — Encounter (HOSPITAL_COMMUNITY): Payer: Self-pay | Admitting: Urology

## 2023-07-17 DIAGNOSIS — C61 Malignant neoplasm of prostate: Secondary | ICD-10-CM | POA: Diagnosis not present

## 2023-07-17 LAB — BASIC METABOLIC PANEL WITH GFR
Anion gap: 5 (ref 5–15)
BUN: 13 mg/dL (ref 8–23)
CO2: 22 mmol/L (ref 22–32)
Calcium: 7.8 mg/dL — ABNORMAL LOW (ref 8.9–10.3)
Chloride: 110 mmol/L (ref 98–111)
Creatinine, Ser: 1.01 mg/dL (ref 0.61–1.24)
GFR, Estimated: >60 mL/min (ref >60)
Glucose, Bld: 109 mg/dL — ABNORMAL HIGH (ref 70–99)
Potassium: 3.8 mmol/L (ref 3.5–5.1)
Sodium: 137 mmol/L (ref 135–145)

## 2023-07-17 LAB — CBC
HCT: 36.4 % — ABNORMAL LOW (ref 39.0–52.0)
Hemoglobin: 11.5 g/dL — ABNORMAL LOW (ref 13.0–17.0)
MCH: 30.8 pg (ref 26.0–34.0)
MCHC: 31.6 g/dL (ref 30.0–36.0)
MCV: 97.6 fL (ref 80.0–100.0)
Platelets: 254 10*3/uL (ref 150–400)
RBC: 3.73 MIL/uL — ABNORMAL LOW (ref 4.22–5.81)
RDW: 14.6 % (ref 11.5–15.5)
WBC: 11.4 10*3/uL — ABNORMAL HIGH (ref 4.0–10.5)
nRBC: 0 % (ref 0.0–0.2)

## 2023-07-17 MED ORDER — ACETAMINOPHEN 500 MG PO TABS: 1000.0000 mg | ORAL_TABLET | Freq: Three times a day (TID) | ORAL | Status: DC

## 2023-07-17 NOTE — Progress Notes (Signed)
   07/17/23 1025  TOC Brief Assessment  Insurance and Status Reviewed  Patient has primary care physician Yes  Home environment has been reviewed Resides in single family home with spouse  Prior level of function: Independent with ADLs at baseline  Prior/Current Home Services No current home services  Social Drivers of Health Review SDOH reviewed no interventions necessary  Readmission risk has been reviewed Yes  Transition of care needs no transition of care needs at this time

## 2023-07-20 LAB — SURGICAL PATHOLOGY

## 2023-07-24 ENCOUNTER — Telehealth: Payer: Self-pay

## 2023-07-24 MED ORDER — ZOLPIDEM TARTRATE 10 MG PO TABS: ORAL_TABLET | ORAL | 3 refills | Status: DC

## 2023-07-24 NOTE — Addendum Note (Signed)
 Addended by: Sheliah Hatch on: 07/24/2023 12:39 PM   Modules accepted: Orders

## 2023-07-24 NOTE — Telephone Encounter (Signed)
 Based on quantity given, I'm assuming this is Zolpidem.  Refilled for pt

## 2023-07-24 NOTE — Telephone Encounter (Signed)
 Sent to PCP ?

## 2023-07-25 NOTE — Telephone Encounter (Signed)
 Patient has been informed and was thankful

## 2023-08-07 ENCOUNTER — Ambulatory Visit
Admission: EM | Admit: 2023-08-07 | Discharge: 2023-08-07 | Disposition: A | Discharge status: Home / Self Care | Attending: Nurse Practitioner | Admitting: Nurse Practitioner

## 2023-08-07 ENCOUNTER — Encounter: Payer: Self-pay | Admitting: Emergency Medicine

## 2023-08-07 DIAGNOSIS — H6122 Impacted cerumen, left ear: Secondary | ICD-10-CM

## 2023-08-07 NOTE — ED Provider Notes (Signed)
 [facility]    CSN: [id number] Arrival date & time: 08/07/23  6387      History   Chief Complaint Chief Complaint  Patient presents with   Ear Fullness    HPI Martin Malone is a 68 y.o. male.   Martin Malone is a 68 year old male who presents with a sensation of fullness or clogging in his left ear, ongoing for the past couple of days. He reports decreased hearing in the affected ear and mild dizziness when standing up quickly. He does not use hearing aids or wear ear pods. He denies ear pain, neck pain, tinnitus, headache, or any recent upper respiratory symptoms, allergies, or illness. He did try taking an allergy medication once, but noted no improvement in his symptoms.  The following portions of the patient's history were reviewed and updated as appropriate: allergies, current medications, past family history, past medical history, past social history, past surgical history, and problem list.      Past Medical History:  Diagnosis Date   Cancer (HCC)    prostate   GERD (gastroesophageal reflux disease)    Hx of no issues   Hyperlipidemia    Rib fracture     Patient Active Problem List   Diagnosis Date Noted   S/P prostatectomy 07/16/2023   Elevated PSA 04/18/2022   Insomnia 10/05/2021   History of malignant neoplasm of skin 09/01/2021   Other seborrheic keratosis 09/01/2021   Melanocytic nevi of trunk 09/01/2021   Plantar fasciitis of right foot 01/02/2019   Left lateral epicondylitis 02/13/2016   Lateral epicondylitis of right elbow 02/06/2014   History of meniscal tear 07/15/2013   BPV (benign positional vertigo) 05/28/2013   Routine general medical examination at a health care facility 11/19/2012   Hyperlipidemia 05/22/2012   Family history of premature CAD 11/29/2011    Past Surgical History:  Procedure Laterality Date   LYMPHADENECTOMY Bilateral 07/16/2023   Procedure: BILATERAL PELVIC LYMPHADENECTOMY;  Surgeon: Lahoma Pigg, MD;   Location: [facility];  Service: Urology;  Laterality: Bilateral;   ROBOT ASSISTED LAPAROSCOPIC RADICAL PROSTATECTOMY N/A 07/16/2023   Procedure: PROSTATECTOMY, RADICAL, ROBOT-ASSISTED, LAPAROSCOPIC;  Surgeon: Lahoma Pigg, MD;  Location: [facility];  Service: Urology;  Laterality: N/A;   TOTAL KNEE ARTHROPLASTY Left 04/17/2004       Home Medications    Prior to Admission medications   Medication Sig Start Date End Date Taking? Authorizing Provider  acetaminophen  (TYLENOL ) 500 MG tablet Take 500-1,000 mg by mouth every 6 (six) hours as needed (pain.).    [provider]  atorvastatin  (LIPITOR) 10 MG tablet TAKE ONE TABLET BY MOUTH EVERY NIGHT AT BEDTIME 05/01/23   Tabori, Katherine E, MD  Brimonidine Tartrate (LUMIFY) 0.025 % SOLN Place 1 drop into both eyes 2 (two) times daily as needed (dry/red/irritated eyes.).    [provider]  Dextromethorphan-guaiFENesin (MUCUS-DM MAX) 60-1200 MG TB12 Take 1 tablet by mouth daily as needed (congestion).    [provider]  docusate sodium  (COLACE) 100 MG capsule Take 1 capsule (100 mg total) by mouth 2 (two) times daily. 07/16/23 07/15/24  Matthew-Onabanjo, [place], MD  HYDROcodone -acetaminophen  (NORCO/VICODIN) 5-325 MG tablet Take 1-2 tablets by mouth every 6 (six) hours as needed for moderate pain (pain score 4-6) or severe pain (pain score 7-10). 07/16/23   Carrolyn Clan, PA-C  loratadine  (CLARITIN ) 10 MG tablet Take 10 mg by mouth in the morning.    [provider]  zolpidem  (AMBIEN ) 10 MG tablet TAKE ONE  TABLET BY MOUTH EVERY NIGHT AT BEDTIME AS NEEDED FOR SLEEP 07/24/23   Jess Morita, MD    Family History Family History  Problem Relation Age of Onset   Coronary artery disease Brother    Dementia Mother    Pancreatitis Father     Social History Social History   Tobacco Use   Smoking status: Former    Current packs/day: 0.00    Types: Cigarettes    Quit date: 04/17/1977    Years since quitting: 46.3    Smokeless tobacco: Never  Vaping Use   Vaping status: Never Used  Substance Use Topics   Alcohol  use: Not Currently    Alcohol /week: 2.0 standard drinks of alcohol     Types: 2 Glasses of wine per week   Drug use: Never     Allergies   Patient has no known allergies.   Review of Systems Review of Systems  Constitutional:  Negative for fever.  HENT:  Negative for congestion, ear discharge, ear pain, facial swelling, postnasal drip, rhinorrhea, sinus pressure, sinus pain, sneezing, sore throat and tinnitus.   Respiratory:  Negative for cough.   Neurological:  Positive for dizziness. Negative for weakness, numbness and headaches.  All other systems reviewed and are negative.    Physical Exam Triage Vital Signs ED Triage Vitals  Encounter Vitals Group     BP 08/07/23 0835 127/82     Systolic BP Percentile --      Diastolic BP Percentile --      Pulse Rate 08/07/23 0835 74     Resp 08/07/23 0835 17     Temp 08/07/23 0835 (!) 97.5 F (36.4 C)     Temp Source 08/07/23 0835 Oral     SpO2 08/07/23 0835 95 %     Weight --      Height --      Head Circumference --      Peak Flow --      Pain Score 08/07/23 0842 0     Pain Loc --      Pain Education --      Exclude from Growth Chart --    No data found.  Updated Vital Signs BP 127/82 (BP Location: Right Arm)   Pulse 74   Temp (!) 97.5 F (36.4 C) (Oral)   Resp 17   SpO2 95%   Visual Acuity Right Eye Distance:   Left Eye Distance:   Bilateral Distance:    Right Eye Near:   Left Eye Near:    Bilateral Near:     Physical Exam Vitals reviewed.  Constitutional:      General: He is not in acute distress.    Appearance: Normal appearance. He is not toxic-appearing.  HENT:     Head: Normocephalic.     Right Ear: Hearing, tympanic membrane, ear canal and external ear normal.     Left Ear: Hearing and external ear normal. There is impacted cerumen.     Mouth/Throat:     Mouth: Mucous membranes are moist.  Eyes:      Conjunctiva/sclera: Conjunctivae normal.  Cardiovascular:     Rate and Rhythm: Normal rate and regular rhythm.     Heart sounds: Normal heart sounds.  Pulmonary:     Effort: Pulmonary effort is normal.     Breath sounds: Normal breath sounds.  Musculoskeletal:        General: Normal range of motion.  Skin:    General: Skin is warm and dry.  Neurological:  General: No focal deficit present.     Mental Status: He is alert and oriented to person, place, and time.      UC Treatments / Results  Labs (all labs ordered are listed, but only abnormal results are displayed) Labs Reviewed - No data to display  EKG   Radiology No results found.  Procedures Ear Cerumen Removal  Date/Time: 08/07/2023 9:51 AM  Performed by: Berniece Brisk, RN Authorized by: Maryruth Sol, FNP   Consent:    Consent obtained:  Verbal   Consent given by:  Patient   Risks, benefits, and alternatives were discussed: yes     Risks discussed:  Bleeding, infection, pain, dizziness, incomplete removal and TM perforation Universal protocol:    Patient identity confirmed:  Verbally with patient and arm band Procedure details:    Location:  L ear   Procedure type: irrigation     Procedure outcomes: cerumen removed   Post-procedure details:    Inspection:  No bleeding and TM intact   Hearing quality:  Improved   Procedure completion:  Tolerated well, no immediate complications  (including critical care time)  Medications Ordered in UC Medications - No data to display  Initial Impression / Assessment and Plan / UC Course  I have reviewed the triage vital signs and the nursing notes.  Pertinent labs & imaging results that were available during my care of the patient were reviewed by me and considered in my medical decision making (see chart for details).     68 year old male who presents with a sensation of fullness or clogging in his left ear without any ear pain, neck pain, tinnitus,  headache, or any recent upper respiratory symptoms, allergies, or illness. The patient is alert, oriented, afebrile, and nontoxic. Physical exam revealed cerumen impaction of the left ear, with the right ear appearing normal. No other abnormalities were noted on examination. The patient underwent ear irrigation performed by the RN, and cerumen was successfully removed with immediate improvement in symptoms. Post-procedure assessment by the provider showed no signs of infection, bleeding, or tympanic membrane perforation. Expected post-procedure symptoms and indications for follow-up were reviewed with the patient.  Today's evaluation has revealed no signs of a dangerous process. Discussed diagnosis with patient and/or guardian. Patient and/or guardian aware of their diagnosis, possible red flag symptoms to watch out for and need for close follow up. Patient and/or guardian understands verbal and written discharge instructions. Patient and/or guardian comfortable with plan and disposition.  Patient and/or guardian has a clear mental status at this time, good insight into illness (after discussion and teaching) and has clear judgment to make decisions regarding their care  Documentation was completed with the aid of voice recognition software. Transcription may contain typographical errors. Final Clinical Impressions(s) / UC Diagnoses   Final diagnoses:  Hearing loss of left ear due to cerumen impaction     Discharge Instructions      You were treated today for earwax buildup, also known as cerumen impaction. The wax was removed using gentle irrigation. Your ear may feel a little sensitive or itchy afterward, which is normal. Keep the ear dry for the rest of the day. Avoid putting anything into the ear, including Q-tips, as this can push wax deeper or irritate the ear canal. If you notice pain, drainage, hearing loss, or dizziness, please return for further evaluation. To help prevent future buildup,  you may use over-the-counter ear drops such as Debrox EarWax Removal as directed or follow up with your  provider for routine cleanings.      ED Prescriptions   None    PDMP not reviewed this encounter.   Beola Brazil [place], [place] 08/07/23 [phone]

## 2023-08-07 NOTE — ED Triage Notes (Signed)
 Pt c/o left ear fullness for 1 week. Denies pain

## 2023-08-07 NOTE — Discharge Instructions (Signed)
 You were treated today for earwax buildup, also known as cerumen impaction. The wax was removed using gentle irrigation. Your ear may feel a little sensitive or itchy afterward, which is normal. Keep the ear dry for the rest of the day. Avoid putting anything into the ear, including Q-tips, as this can push wax deeper or irritate the ear canal. If you notice pain, drainage, hearing loss, or dizziness, please return for further evaluation. To help prevent future buildup, you may use over-the-counter ear drops such as Debrox EarWax Removal as directed or follow up with your provider for routine cleanings.

## 2023-10-16 ENCOUNTER — Ambulatory Visit (INDEPENDENT_AMBULATORY_CARE_PROVIDER_SITE_OTHER): Admitting: *Deleted

## 2023-10-16 VITALS — Ht 70.0 in | Wt 171.0 lb

## 2023-10-16 DIAGNOSIS — Z0000 Encounter for general adult medical examination without abnormal findings: Secondary | ICD-10-CM | POA: Diagnosis not present

## 2023-10-16 NOTE — Progress Notes (Signed)
 Subjective:   Martin Malone is a 68 y.o. male who presents for Medicare Annual/Subsequent preventive examination.  Visit Complete: Virtual I connected with  [name] on 10/16/23 by a audio enabled telemedicine application and verified that I am speaking with the correct person using two identifiers.  Patient Location: Home  Provider Location: Home Office  I discussed the limitations of evaluation and management by telemedicine. The patient expressed understanding and agreed to proceed.  Vital Signs: Because this visit was a virtual/telehealth visit, some criteria may be missing or patient reported. Any vitals not documented were not able to be obtained and vitals that have been documented are patient reported. .  Cardiac Risk Factors include: advanced age (>23men, >81 women);male gender     Objective:    Today's Vitals   10/16/23 0817  Weight: 171 lb (77.6 kg)  Height: 5' 10 (1.778 m)   Body mass index is 24.54 kg/m.     10/16/2023    8:13 AM 07/16/2023    2:09 PM 07/16/2023    6:07 AM 07/05/2023    9:05 AM 09/06/2022    9:04 AM 09/01/2021    4:55 PM 01/27/2020    8:57 AM  Advanced Directives  Does Patient Have a Medical Advance Directive? Yes  Yes Yes Yes Yes Yes  Type of Estate agent of [employer] Power of [place];Living will Healthcare Power of [place];Living will Healthcare Power of [place];Living will Living will;Healthcare Power of Attorney Living will;Healthcare Power of [employer] Power of [place];Living will  Does patient want to make changes to medical advance directive?  No - Patient declined  No - Patient declined   Yes (MAU/Ambulatory/Procedural Areas - Information given)  Copy of Healthcare Power of Attorney in Chart? No - copy requested No - copy requested No - copy requested No - copy requested No - copy requested No - copy requested No - copy requested    Current Medications (verified) Outpatient  Encounter Medications as of 10/16/2023  Medication Sig   acetaminophen  (TYLENOL ) 500 MG tablet Take 500-1,000 mg by mouth every 6 (six) hours as needed (pain.).   atorvastatin  (LIPITOR) 10 MG tablet TAKE ONE TABLET BY MOUTH EVERY NIGHT AT BEDTIME   Brimonidine Tartrate (LUMIFY) 0.025 % SOLN Place 1 drop into both eyes 2 (two) times daily as needed (dry/red/irritated eyes.).   Dextromethorphan-guaiFENesin (MUCUS-DM MAX) 60-1200 MG TB12 Take 1 tablet by mouth daily as needed (congestion).   docusate sodium  (COLACE) 100 MG capsule Take 1 capsule (100 mg total) by mouth 2 (two) times daily.   HYDROcodone -acetaminophen  (NORCO/VICODIN) 5-325 MG tablet Take 1-2 tablets by mouth every 6 (six) hours as needed for moderate pain (pain score 4-6) or severe pain (pain score 7-10).   loratadine  (CLARITIN ) 10 MG tablet Take 10 mg by mouth in the morning.   meloxicam  (MOBIC ) 15 MG tablet Take 15 mg by mouth as needed for pain.   tadalafil (CIALIS) 5 MG tablet Take 5 mg by mouth daily as needed for erectile dysfunction.   zolpidem  (AMBIEN ) 10 MG tablet TAKE ONE TABLET BY MOUTH EVERY NIGHT AT BEDTIME AS NEEDED FOR SLEEP   No facility-administered encounter medications on file as of 10/16/2023.    Allergies (verified) Patient has no known allergies.   History: Past Medical History:  Diagnosis Date   Cancer (HCC)    prostate   GERD (gastroesophageal reflux disease)    Hx of no issues   Hyperlipidemia    Rib fracture  Past Surgical History:  Procedure Laterality Date   LYMPHADENECTOMY Bilateral 07/16/2023   Procedure: BILATERAL PELVIC LYMPHADENECTOMY;  Surgeon: Selma Donnice SAUNDERS, MD;  Location: [facility];  Service: Urology;  Laterality: Bilateral;   ROBOT ASSISTED LAPAROSCOPIC RADICAL PROSTATECTOMY N/A 07/16/2023   Procedure: PROSTATECTOMY, RADICAL, ROBOT-ASSISTED, LAPAROSCOPIC;  Surgeon: Selma Donnice SAUNDERS, MD;  Location: [facility];  Service: Urology;  Laterality: N/A;   TOTAL KNEE ARTHROPLASTY Left 04/17/2004    Family History  Problem Relation Age of Onset   Coronary artery disease Brother    Dementia Mother    Pancreatitis Father    Social History   Socioeconomic History   Marital status: Married    Spouse name: Rock   Number of children: 1   Years of education: Not on file   Highest education level: Associate degree: occupational, Scientist, product/process development, or vocational program  Occupational History   Occupation: Advice worker  Tobacco Use   Smoking status: Former    Current packs/day: 0.00    Types: Cigarettes    Quit date: 04/17/1977    Years since quitting: 46.5   Smokeless tobacco: Never  Vaping Use   Vaping status: Never Used  Substance and Sexual Activity   Alcohol  use: Not Currently    Alcohol /week: 2.0 standard drinks of alcohol     Types: 2 Glasses of wine per week   Drug use: Never   Sexual activity: Yes  Other Topics Concern   Not on file  Social History Narrative   ** Merged History Encounter **       Social Drivers of Health   Financial Resource Strain: Low Risk  (10/16/2023)   Overall Financial Resource Strain (CARDIA)    Difficulty of Paying Living Expenses: Not hard at all  Food Insecurity: No Food Insecurity (10/16/2023)   Hunger Vital Sign    Worried About Running Out of Food in the Last Year: Never true    Ran Out of Food in the Last Year: Never true  Transportation Needs: No Transportation Needs (10/16/2023)   PRAPARE - Administrator, Civil Service (Medical): No    Lack of Transportation (Non-Medical): No  Physical Activity: Sufficiently Active (10/16/2023)   Exercise Vital Sign    Days of Exercise per Week: 5 days    Minutes of Exercise per Session: 50 min  Stress: No Stress Concern Present (10/16/2023)   [name]    Feeling of Stress: Not at all  Social Connections: Moderately Integrated (10/16/2023)   Social Connection and Isolation Panel    Frequency of Communication with Friends  and Family: Three times a week    Frequency of Social Gatherings with Friends and Family: Once a week    Attends Religious Services: Never    Database administrator or Organizations: Yes    Attends Engineer, structural: More than 4 times per year    Marital Status: Married    Tobacco Counseling Counseling given: Not Answered   Clinical Intake:  Pre-visit preparation completed: Yes  Pain : No/denies pain     Diabetes: No  How often do you need to have someone help you when you read instructions, pamphlets, or other written materials from your doctor or pharmacy?: 1 - Never  Interpreter Needed?: No  Information entered by :: Mliss Graff LPN   Activities of Daily Living    10/16/2023    8:16 AM 07/16/2023    2:09 PM  In your present state of health,  do you have any difficulty performing the following activities:  Hearing? 0 0  Vision? 0 0  Difficulty concentrating or making decisions? 0 0  Walking or climbing stairs? 0   Dressing or bathing? 0   Doing errands, shopping? 0 0  Preparing Food and eating ? N   In the past six months, have you accidently leaked urine? Y   Do you have problems with loss of bowel control? N   Managing your Medications? N   Managing your Finances? N   Housekeeping or managing your Housekeeping? N     Patient Care Team: Tabori, Katherine E, MD as PCP - General (Family Medicine) Wonda Sharper, MD as Consulting Physician (Cardiology)  Indicate any recent Medical Services you may have received from other than [facility] providers in the past year (date may be approximate).     Assessment:   This is a routine wellness examination for [place].  Hearing/Vision screen Hearing Screening - Comments:: No trouble hearing Vision Screening - Comments:: [facility] Up to date   Goals Addressed             This Visit's Progress    Patient Stated   On track    Stay happy, healthy, active, independent     Patient Stated   On track     Continue current lifestyle     Patient Stated       Maintain lifestyle       Depression Screen    10/16/2023    8:20 AM 04/30/2023   10:08 AM 10/27/2022    1:08 PM 09/06/2022    9:09 AM 04/18/2022   10:12 AM 10/05/2021    8:56 AM 09/01/2021    4:54 PM  PHQ 2/9 Scores  PHQ - 2 Score 0 0 0 0 0 0 0  PHQ- 9 Score 0 0 0 0 0 0     Fall Risk    10/16/2023    8:13 AM 04/30/2023   10:08 AM 10/27/2022    1:08 PM 09/06/2022    9:03 AM 04/18/2022   10:12 AM  Fall Risk   Falls in the past year? 0 0 0 0 0  Number falls in past yr: 0 0 0 0 0  Injury with Fall? 0 0 0 0 0  Risk for fall due to :   No Fall Risks  No Fall Risks  Follow up Falls evaluation completed;Education provided;Falls prevention discussed  Falls evaluation completed Falls evaluation completed;Education provided;Falls prevention discussed Falls evaluation completed      Data saved with a previous flowsheet row definition    MEDICARE RISK AT HOME: Medicare Risk at Home Any stairs in or around the home?: Yes If so, are there any without handrails?: No Home free of loose throw rugs in walkways, pet beds, electrical cords, etc?: Yes Adequate lighting in your home to reduce risk of falls?: Yes Life alert?: No Use of a cane, walker or w/c?: No Grab bars in the bathroom?: No Shower chair or bench in shower?: No Elevated toilet seat or a handicapped toilet?: No  TIMED UP AND GO:  Was the test performed?  No    Cognitive Function:        10/16/2023    8:17 AM 09/06/2022    9:07 AM 09/01/2021    4:57 PM  6CIT Screen  What Year? 0 points 0 points 0 points  What month? 0 points 0 points 0 points  What time? 0 points 0 points 0 points  Count back from 20 0 points 0 points 0 points  Months in reverse 0 points 0 points 0 points  Repeat phrase 0 points 0 points 0 points  Total Score 0 points 0 points 0 points    Immunizations Immunization History  Administered Date(s) Administered   Fluad Quad(high Dose 65+) 01/27/2020,  01/24/2021   Influenza, Seasonal, Injecte, Preservative Fre 01/17/2015   Influenza,inj,Quad PF,6+ Mos 01/15/2013, 01/15/2014, 12/22/2015, 12/28/2016, 01/21/2018, 01/23/2019   Influenza-Unspecified 01/13/2022, 01/05/2023   PFIZER(Purple Top)SARS-COV-2 Vaccination 07/11/2019, 08/01/2019, 02/11/2020, 12/09/2020, 02/09/2021   Pfizer Covid-19 Vaccine Bivalent Booster 26yrs & up 02/03/2021   Pneumococcal Conjugate-13 01/27/2020   Pneumococcal Polysaccharide-23 10/05/2021   Tdap 04/17/2008, 01/23/2019   Zoster, Live 05/28/2013    TDAP status: Up to date  Flu Vaccine status: Up to date  Pneumococcal vaccine status: Up to date  Covid-19 vaccine status: Completed vaccines  Qualifies for Shingles Vaccine? Yes   Zostavax completed Yes   Shingrix Completed?: Yes  Screening Tests Health Maintenance  Topic Date Due   Zoster Vaccines- Shingrix (1 of 2) 11/02/1973   COVID-19 Vaccine (6 - 2024-25 season) 12/17/2022   INFLUENZA VACCINE  11/16/2023   Medicare Annual Wellness (AWV)  10/15/2024   Colonoscopy  03/24/2027   DTaP/Tdap/Td (3 - Td or Tdap) 01/22/2029   Pneumococcal Vaccine: 50+ Years  Completed   Hepatitis C Screening  Completed   Hepatitis B Vaccines  Aged Out   HPV VACCINES  Aged Out   Meningococcal B Vaccine  Aged Out    Health Maintenance  Health Maintenance Due  Topic Date Due   Zoster Vaccines- Shingrix (1 of 2) 11/02/1973   COVID-19 Vaccine (6 - 2024-25 season) 12/17/2022    Colorectal cancer screening: Type of screening: Colonoscopy. Completed 2023. Repeat every 5 years  Lung Cancer Screening: (Low Dose CT Chest recommended if Age 66-80 years, 20 pack-year currently smoking OR have quit w/in 15years.) does not qualify.   Lung Cancer Screening Referral:   Additional Screening:  Hepatitis C Screening: does not qualify; Completed 2025  Vision Screening: Recommended annual ophthalmology exams for early detection of glaucoma and other disorders of the eye. Is the  patient up to date with their annual eye exam?  Yes  Who is the provider or what is the name of the office in which the patient attends annual eye exams? [facility] If pt is not established with a provider, would they like to be referred to a provider to establish care? No .   Dental Screening: Recommended annual dental exams for proper oral hygiene   Community Resource Referral / Chronic Care Management: CRR required this visit?  No   CCM required this visit?  No     Plan:     I have personally reviewed and noted the following in the patient's chart:   Medical and social history Use of alcohol , tobacco or illicit drugs  Current medications and supplements including opioid prescriptions. Patient is currently taking opioid prescriptions. Information provided to patient regarding non-opioid alternatives. Patient advised to discuss non-opioid treatment plan with their provider. Functional ability and status Nutritional status Physical activity Advanced directives List of other physicians Hospitalizations, surgeries, and ER visits in previous 12 months Vitals Screenings to include cognitive, depression, and falls Referrals and appointments  In addition, I have reviewed and discussed with patient certain preventive protocols, quality metrics, and best practice recommendations. A written personalized care plan for preventive services as well as general preventive health recommendations were provided to patient.  Mliss Graff, LPN   05/25/7972   After Visit Summary: (MyChart) Due to this being a telephonic visit, the after visit summary with patients personalized plan was offered to patient via MyChart   Nurse Notes:

## 2023-10-16 NOTE — Patient Instructions (Signed)
 [name] , Thank you for taking time to come for your Medicare Wellness Visit. I appreciate your ongoing commitment to your health goals. Please review the following plan we discussed and let me know if I can assist you in the future.   Screening recommendations/referrals: Colonoscopy: up to date Recommended yearly ophthalmology/optometry visit for glaucoma screening and checkup Recommended yearly dental visit for hygiene and checkup  Vaccinations: Influenza vaccine: up to date Pneumococcal vaccine: up to date Tdap vaccine: up to date Shingles vaccine: up to date       Preventive Care 65 Years and Older, Male Preventive care refers to lifestyle choices and visits with your health care provider that can promote health and wellness. What does preventive care include? A yearly physical exam. This is also called an annual well check. Dental exams once or twice a year. Routine eye exams. Ask your health care provider how often you should have your eyes checked. Personal lifestyle choices, including: Daily care of your teeth and gums. Regular physical activity. Eating a healthy diet. Avoiding tobacco and drug use. Limiting alcohol  use. Practicing safe sex. Taking low doses of aspirin every day. Taking vitamin and mineral supplements as recommended by your health care provider. What happens during an annual well check? The services and screenings done by your health care provider during your annual well check will depend on your age, overall health, lifestyle risk factors, and family history of disease. Counseling  Your health care provider may ask you questions about your: Alcohol  use. Tobacco use. Drug use. Emotional well-being. Home and relationship well-being. Sexual activity. Eating habits. History of falls. Memory and ability to understand (cognition). Work and work Astronomer. Screening  You may have the following tests or measurements: Height, weight, and  BMI. Blood pressure. Lipid and cholesterol levels. These may be checked every 5 years, or more frequently if you are over 40 years old. Skin check. Lung cancer screening. You may have this screening every year starting at age 27 if you have a 30-pack-year history of smoking and currently smoke or have quit within the past 15 years. Fecal occult blood test (FOBT) of the stool. You may have this test every year starting at age 53. Flexible sigmoidoscopy or colonoscopy. You may have a sigmoidoscopy every 5 years or a colonoscopy every 10 years starting at age 68. Prostate cancer screening. Recommendations will vary depending on your family history and other risks. Hepatitis C blood test. Hepatitis B blood test. Sexually transmitted disease (STD) testing. Diabetes screening. This is done by checking your blood sugar (glucose) after you have not eaten for a while (fasting). You may have this done every 1-3 years. Abdominal aortic aneurysm (AAA) screening. You may need this if you are a current or former smoker. Osteoporosis. You may be screened starting at age 57 if you are at high risk. Talk with your health care provider about your test results, treatment options, and if necessary, the need for more tests. Vaccines  Your health care provider may recommend certain vaccines, such as: Influenza vaccine. This is recommended every year. Tetanus, diphtheria, and acellular pertussis (Tdap, Td) vaccine. You may need a Td booster every 10 years. Zoster vaccine. You may need this after age 35. Pneumococcal 13-valent conjugate (PCV13) vaccine. One dose is recommended after age 79. Pneumococcal polysaccharide (PPSV23) vaccine. One dose is recommended after age 79. Talk to your health care provider about which screenings and vaccines you need and how often you need them. This information is not  intended to replace advice given to you by your health care provider. Make sure you discuss any questions you have  with your health care provider. Document Released: 04/30/2015 Document Revised: 12/22/2015 Document Reviewed: 02/02/2015 Elsevier Interactive Patient Education  2017 [employer].  Fall Prevention in the Home Falls can cause injuries. They can happen to people of all ages. There are many things you can do to make your home safe and to help prevent falls. What can I do on the outside of my home? Regularly fix the edges of walkways and driveways and fix any cracks. Remove anything that might make you trip as you walk through a door, such as a raised step or threshold. Trim any bushes or trees on the path to your home. Use bright outdoor lighting. Clear any walking paths of anything that might make someone trip, such as rocks or tools. Regularly check to see if handrails are loose or broken. Make sure that both sides of any steps have handrails. Any raised decks and porches should have guardrails on the edges. Have any leaves, snow, or ice cleared regularly. Use sand or salt on walking paths during winter. Clean up any spills in your garage right away. This includes oil or grease spills. What can I do in the bathroom? Use night lights. Install grab bars by the toilet and in the tub and shower. Do not use towel bars as grab bars. Use non-skid mats or decals in the tub or shower. If you need to sit down in the shower, use a plastic, non-slip stool. Keep the floor dry. Clean up any water that spills on the floor as soon as it happens. Remove soap buildup in the tub or shower regularly. Attach bath mats securely with double-sided non-slip rug tape. Do not have throw rugs and other things on the floor that can make you trip. What can I do in the bedroom? Use night lights. Make sure that you have a light by your bed that is easy to reach. Do not use any sheets or blankets that are too big for your bed. They should not hang down onto the floor. Have a firm chair that has side arms. You can use  this for support while you get dressed. Do not have throw rugs and other things on the floor that can make you trip. What can I do in the kitchen? Clean up any spills right away. Avoid walking on wet floors. Keep items that you use a lot in easy-to-reach places. If you need to reach something above you, use a strong step stool that has a grab bar. Keep electrical cords out of the way. Do not use floor polish or wax that makes floors slippery. If you must use wax, use non-skid floor wax. Do not have throw rugs and other things on the floor that can make you trip. What can I do with my stairs? Do not leave any items on the stairs. Make sure that there are handrails on both sides of the stairs and use them. Fix handrails that are broken or loose. Make sure that handrails are as long as the stairways. Check any carpeting to make sure that it is firmly attached to the stairs. Fix any carpet that is loose or worn. Avoid having throw rugs at the top or bottom of the stairs. If you do have throw rugs, attach them to the floor with carpet tape. Make sure that you have a light switch at the top of the stairs  and the bottom of the stairs. If you do not have them, ask someone to add them for you. What else can I do to help prevent falls? Wear shoes that: Do not have high heels. Have rubber bottoms. Are comfortable and fit you well. Are closed at the toe. Do not wear sandals. If you use a stepladder: Make sure that it is fully opened. Do not climb a closed stepladder. Make sure that both sides of the stepladder are locked into place. Ask someone to hold it for you, if possible. Clearly mark and make sure that you can see: Any grab bars or handrails. First and last steps. Where the edge of each step is. Use tools that help you move around (mobility aids) if they are needed. These include: Canes. Walkers. Scooters. Crutches. Turn on the lights when you go into a dark area. Replace any light bulbs  as soon as they burn out. Set up your furniture so you have a clear path. Avoid moving your furniture around. If any of your floors are uneven, fix them. If there are any pets around you, be aware of where they are. Review your medicines with your doctor. Some medicines can make you feel dizzy. This can increase your chance of falling. Ask your doctor what other things that you can do to help prevent falls. This information is not intended to replace advice given to you by your health care provider. Make sure you discuss any questions you have with your health care provider. Document Released: 01/28/2009 Document Revised: 09/09/2015 Document Reviewed: 05/08/2014 Elsevier Interactive Patient Education  2017 [employer].

## 2023-10-24 ENCOUNTER — Encounter: Payer: Self-pay | Admitting: Family Medicine

## 2023-10-24 ENCOUNTER — Ambulatory Visit (INDEPENDENT_AMBULATORY_CARE_PROVIDER_SITE_OTHER): Admitting: Family Medicine

## 2023-10-24 ENCOUNTER — Ambulatory Visit: Payer: Self-pay | Admitting: Family Medicine

## 2023-10-24 ENCOUNTER — Other Ambulatory Visit: Payer: Self-pay

## 2023-10-24 VITALS — BP 112/70 | HR 49 | Temp 98.0°F | Ht 70.0 in | Wt 173.2 lb

## 2023-10-24 DIAGNOSIS — E785 Hyperlipidemia, unspecified: Secondary | ICD-10-CM | POA: Diagnosis not present

## 2023-10-24 LAB — LIPID PANEL
Cholesterol: 186 mg/dL (ref 0–200)
HDL: 74.1 mg/dL (ref >39.00)
LDL Cholesterol: 102 mg/dL — ABNORMAL HIGH (ref 0–99)
NonHDL: 111.63
Total CHOL/HDL Ratio: 3
Triglycerides: 48 mg/dL (ref 0.0–149.0)
VLDL: 9.6 mg/dL (ref 0.0–40.0)

## 2023-10-24 LAB — CBC WITH DIFFERENTIAL/PLATELET
Basophils Absolute: 0.1 K/uL (ref 0.0–0.1)
Basophils Relative: 0.7 % (ref 0.0–3.0)
Eosinophils Absolute: 0.2 K/uL (ref 0.0–0.7)
Eosinophils Relative: 2.7 % (ref 0.0–5.0)
HCT: 40.8 % (ref 39.0–52.0)
Hemoglobin: 13.6 g/dL (ref 13.0–17.0)
Lymphocytes Relative: 23.1 % (ref 12.0–46.0)
Lymphs Abs: 1.8 K/uL (ref 0.7–4.0)
MCHC: 33.3 g/dL (ref 30.0–36.0)
MCV: 91.3 fl (ref 78.0–100.0)
Monocytes Absolute: 0.5 K/uL (ref 0.1–1.0)
Monocytes Relative: 6.3 % (ref 3.0–12.0)
Neutro Abs: 5.2 K/uL (ref 1.4–7.7)
Neutrophils Relative %: 67.2 % (ref 43.0–77.0)
Platelets: 311 K/uL (ref 150.0–400.0)
RBC: 4.47 Mil/uL (ref 4.22–5.81)
RDW: 15.6 % — ABNORMAL HIGH (ref 11.5–15.5)
WBC: 7.8 K/uL (ref 4.0–10.5)

## 2023-10-24 LAB — BASIC METABOLIC PANEL WITH GFR
BUN: 22 mg/dL (ref 6–23)
CO2: 31 meq/L (ref 19–32)
Calcium: 9.6 mg/dL (ref 8.4–10.5)
Chloride: 105 meq/L (ref 96–112)
Creatinine, Ser: 1.18 mg/dL (ref 0.40–1.50)
GFR: 63.2 mL/min (ref >60.00)
Glucose, Bld: 89 mg/dL (ref 70–99)
Potassium: 5.2 meq/L — ABNORMAL HIGH (ref 3.5–5.1)
Sodium: 140 meq/L (ref 135–145)

## 2023-10-24 LAB — HEPATIC FUNCTION PANEL
ALT: 15 U/L (ref 0–53)
AST: 22 U/L (ref 0–37)
Albumin: 4.5 g/dL (ref 3.5–5.2)
Alkaline Phosphatase: 44 U/L (ref 39–117)
Bilirubin, Direct: 0.1 mg/dL (ref 0.0–0.3)
Total Bilirubin: 0.6 mg/dL (ref 0.2–1.2)
Total Protein: 7.5 g/dL (ref 6.0–8.3)

## 2023-10-24 LAB — TSH: TSH: 1.91 u[IU]/mL (ref 0.35–5.50)

## 2023-10-24 MED ORDER — ATORVASTATIN CALCIUM 10 MG PO TABS: ORAL_TABLET | ORAL | 1 refills | Status: DC

## 2023-10-24 NOTE — Patient Instructions (Signed)
 Follow up in 6 months to recheck cholesterol We'll notify you of your lab results and make any changes if needed Keep up the good work on healthy diet and regular exercise- you look great! Call with any questions or concerns Stay Safe!  Stay Healthy! Enjoy the rest of your summer!!!

## 2023-10-24 NOTE — Assessment & Plan Note (Signed)
 Chronic problem.  On Lipitor 10mg  daily w/o difficulty.  Check labs.  Adjust meds prn

## 2023-10-24 NOTE — Progress Notes (Signed)
   Subjective:    Patient ID: Martin Malone, male    DOB: 22-Jul-1955, 68 y.o.   MRN: [id number]  HPI Hyperlipidemia- chronic problem, on Lipitor 10mg  daily.  No CP, SOB, abd pain, N/V.  Continues to exercise regularly.   Review of Systems For ROS see HPI     Objective:   Physical Exam Vitals reviewed.  Constitutional:      General: He is not in acute distress.    Appearance: Normal appearance. He is well-developed.  HENT:     Head: Normocephalic and atraumatic.  Eyes:     Extraocular Movements: Extraocular movements intact.     Conjunctiva/sclera: Conjunctivae normal.     Pupils: Pupils are equal, round, and reactive to light.  Neck:     Thyroid : No thyromegaly.  Cardiovascular:     Rate and Rhythm: Normal rate and regular rhythm.     Pulses: Normal pulses.     Heart sounds: Normal heart sounds. No murmur heard. Pulmonary:     Effort: Pulmonary effort is normal. No respiratory distress.     Breath sounds: Normal breath sounds.  Abdominal:     General: Bowel sounds are normal. There is no distension.     Palpations: Abdomen is soft.  Musculoskeletal:     Cervical back: Normal range of motion and neck supple.     Right lower leg: No edema.     Left lower leg: No edema.  Lymphadenopathy:     Cervical: No cervical adenopathy.  Skin:    General: Skin is warm and dry.  Neurological:     General: No focal deficit present.     Mental Status: He is alert and oriented to person, place, and time.     Cranial Nerves: No cranial nerve deficit.  Psychiatric:        Mood and Affect: Mood normal.        Behavior: Behavior normal.           Assessment & Plan:

## 2023-10-30 ENCOUNTER — Ambulatory Visit: Payer: Medicare Other | Admitting: Family Medicine

## 2023-11-13 ENCOUNTER — Other Ambulatory Visit: Payer: Self-pay | Admitting: Family Medicine

## 2023-11-13 MED ORDER — MELOXICAM 15 MG PO TABS: 15.0000 mg | ORAL_TABLET | ORAL | 1 refills | Status: DC | PRN

## 2023-11-13 NOTE — Telephone Encounter (Unsigned)
 Copied from CRM [id number]. Topic: Clinical - Medication Refill >> Nov 13, 2023  3:50 PM Aisha D wrote: Medication: meloxicam  (MOBIC ) 15 MG tablet  Has the patient contacted their pharmacy? Yes (Agent: If no, request that the patient contact the pharmacy for the refill. If patient does not wish to contact the pharmacy document the reason why and proceed with request.) (Agent: If yes, when and what did the pharmacy advise?)  This is the patient's preferred pharmacy:  [facility] [id number], [place] - [address] [place] [address] Phone: [phone] Fax: [phone]  Is this the correct pharmacy for this prescription? Yes If no, delete pharmacy and type the correct one.   Has the prescription been filled recently? No  Is the patient out of the medication? Yes  Has the patient been seen for an appointment in the last year OR does the patient have an upcoming appointment? Yes  Can we respond through MyChart? Yes  Agent: Please be advised that Rx refills may take up to 3 business days. We ask that you follow-up with your pharmacy.

## 2023-11-13 NOTE — Telephone Encounter (Signed)
 Requested Prescriptions   Pending Prescriptions Disp Refills   meloxicam  (MOBIC ) 15 MG tablet      Sig: Take 1 tablet (15 mg total) by mouth as needed for pain.     Date of patient request: 11/13/2023  Last office visit: 10/24/2023 Upcoming visit: 04/28/2024 Date of last refill: 10/16/2023 Last refill amount:    Requesting Rx under Dr Donaciano care okay to fill?

## 2023-11-21 ENCOUNTER — Telehealth: Payer: Self-pay

## 2023-11-21 MED ORDER — ZOLPIDEM TARTRATE 10 MG PO TABS: ORAL_TABLET | ORAL | 3 refills | Status: DC

## 2023-11-21 NOTE — Telephone Encounter (Signed)
 Ambien  sent to pharmacy. Called patient to let them know. Patient is aware and will pick up when ready

## 2023-11-21 NOTE — Telephone Encounter (Signed)
 Sent to Dr.Tabori

## 2023-11-21 NOTE — Addendum Note (Signed)
 Addended by: Raven Furnas E on: 11/21/2023 03:11 PM   Modules accepted: Orders

## 2024-01-09 ENCOUNTER — Other Ambulatory Visit: Payer: Self-pay | Admitting: Family

## 2024-01-15 ENCOUNTER — Telehealth: Payer: Self-pay

## 2024-01-15 MED ORDER — MELOXICAM 15 MG PO TABS: 15.0000 mg | ORAL_TABLET | ORAL | 1 refills | Status: AC | PRN

## 2024-01-15 NOTE — Telephone Encounter (Signed)
 Sent to PCP ?

## 2024-01-15 NOTE — Addendum Note (Signed)
 Addended by: Markiyah Gahm E on: 01/15/2024 11:14 AM   Modules accepted: Orders

## 2024-04-18 ENCOUNTER — Other Ambulatory Visit: Payer: Self-pay

## 2024-04-18 DIAGNOSIS — E785 Hyperlipidemia, unspecified: Secondary | ICD-10-CM

## 2024-04-18 MED ORDER — ATORVASTATIN CALCIUM 10 MG PO TABS: ORAL_TABLET | ORAL | 1 refills | Status: AC

## 2024-04-21 ENCOUNTER — Other Ambulatory Visit: Payer: Self-pay

## 2024-04-21 ENCOUNTER — Encounter: Payer: Self-pay | Admitting: Family Medicine

## 2024-04-21 MED ORDER — ZOLPIDEM TARTRATE 10 MG PO TABS: ORAL_TABLET | ORAL | 3 refills | Status: AC

## 2024-04-21 NOTE — Telephone Encounter (Signed)
 Patient is questioning if you would be willing to order PSA testing at his appointment on 1/12? Patient states he has an appointment with Dr.Gay and would like to avoid double lab work? Please view message below

## 2024-04-28 ENCOUNTER — Encounter: Payer: Self-pay | Admitting: Family Medicine

## 2024-04-28 ENCOUNTER — Ambulatory Visit: Admitting: Family Medicine

## 2024-04-28 VITALS — BP 140/80 | HR 56 | Ht 70.0 in | Wt 181.4 lb

## 2024-04-28 DIAGNOSIS — Z8546 Personal history of malignant neoplasm of prostate: Secondary | ICD-10-CM | POA: Insufficient documentation

## 2024-04-28 DIAGNOSIS — E785 Hyperlipidemia, unspecified: Secondary | ICD-10-CM | POA: Diagnosis not present

## 2024-04-28 DIAGNOSIS — E663 Overweight: Secondary | ICD-10-CM

## 2024-04-28 DIAGNOSIS — Z125 Encounter for screening for malignant neoplasm of prostate: Secondary | ICD-10-CM | POA: Diagnosis not present

## 2024-04-28 LAB — HEPATIC FUNCTION PANEL
ALT: 17 U/L (ref 3–53)
AST: 20 U/L (ref 5–37)
Albumin: 4.2 g/dL (ref 3.5–5.2)
Alkaline Phosphatase: 53 U/L (ref 39–117)
Bilirubin, Direct: 0.1 mg/dL (ref 0.1–0.3)
Total Bilirubin: 0.4 mg/dL (ref 0.2–1.2)
Total Protein: 6.8 g/dL (ref 6.0–8.3)

## 2024-04-28 LAB — CBC WITH DIFFERENTIAL/PLATELET
Basophils Absolute: 0.1 K/uL (ref 0.0–0.1)
Basophils Relative: 0.6 % (ref 0.0–3.0)
Eosinophils Absolute: 0.3 K/uL (ref 0.0–0.7)
Eosinophils Relative: 3.5 % (ref 0.0–5.0)
HCT: 41.7 % (ref 39.0–52.0)
Hemoglobin: 13.9 g/dL (ref 13.0–17.0)
Lymphocytes Relative: 24.8 % (ref 12.0–46.0)
Lymphs Abs: 2 K/uL (ref 0.7–4.0)
MCHC: 33.3 g/dL (ref 30.0–36.0)
MCV: 93.2 fl (ref 78.0–100.0)
Monocytes Absolute: 0.5 K/uL (ref 0.1–1.0)
Monocytes Relative: 5.9 % (ref 3.0–12.0)
Neutro Abs: 5.2 K/uL (ref 1.4–7.7)
Neutrophils Relative %: 65.2 % (ref 43.0–77.0)
Platelets: 280 K/uL (ref 150.0–400.0)
RBC: 4.47 Mil/uL (ref 4.22–5.81)
RDW: 15 % (ref 11.5–15.5)
WBC: 8 K/uL (ref 4.0–10.5)

## 2024-04-28 LAB — LIPID PANEL
Cholesterol: 173 mg/dL (ref 28–200)
HDL: 71.2 mg/dL
LDL Cholesterol: 88 mg/dL (ref 10–99)
NonHDL: 102.01
Total CHOL/HDL Ratio: 2
Triglycerides: 68 mg/dL (ref 10.0–149.0)
VLDL: 13.6 mg/dL (ref 0.0–40.0)

## 2024-04-28 LAB — BASIC METABOLIC PANEL WITH GFR
BUN: 19 mg/dL (ref 6–23)
CO2: 28 meq/L (ref 19–32)
Calcium: 8.9 mg/dL (ref 8.4–10.5)
Chloride: 105 meq/L (ref 96–112)
Creatinine, Ser: 1.07 mg/dL (ref 0.40–1.50)
GFR: 70.82 mL/min
Glucose, Bld: 87 mg/dL (ref 70–99)
Potassium: 5.3 meq/L — ABNORMAL HIGH (ref 3.5–5.1)
Sodium: 138 meq/L (ref 135–145)

## 2024-04-28 LAB — TSH: TSH: 2.2 u[IU]/mL (ref 0.35–5.50)

## 2024-04-28 LAB — PSA, MEDICARE: PSA: 0 ng/mL — ABNORMAL LOW (ref 0.10–4.00)

## 2024-04-28 NOTE — Progress Notes (Unsigned)
" ° °  Subjective:    Patient ID: Martin Malone, male    DOB: 05-10-1955, 69 y.o.   MRN: [id number]  HPI Hyperlipidemia- chronic problem, on Lipitor 10mg  daily.  Pt reports feeling great.  No CP, SOB, abd pain, N/V.  Overweight- pt has gained 8 lbs since July.  BMI now 26.  Continues to walk regularly.  Hx of Prostate Cancer- has appt upcoming w/ urology and needs PSA level done.   Review of Systems For ROS see HPI     Objective:   Physical Exam Vitals reviewed.  Constitutional:      General: He is not in acute distress.    Appearance: Normal appearance. He is well-developed. He is not ill-appearing.  HENT:     Head: Normocephalic and atraumatic.  Eyes:     Extraocular Movements: Extraocular movements intact.     Conjunctiva/sclera: Conjunctivae normal.     Pupils: Pupils are equal, round, and reactive to light.  Neck:     Thyroid : No thyromegaly.  Cardiovascular:     Rate and Rhythm: Normal rate and regular rhythm.     Pulses: Normal pulses.     Heart sounds: Normal heart sounds. No murmur heard. Pulmonary:     Effort: Pulmonary effort is normal. No respiratory distress.     Breath sounds: Normal breath sounds.  Abdominal:     General: Bowel sounds are normal. There is no distension.     Palpations: Abdomen is soft.  Musculoskeletal:     Cervical back: Normal range of motion and neck supple.     Right lower leg: No edema.     Left lower leg: No edema.  Lymphadenopathy:     Cervical: No cervical adenopathy.  Skin:    General: Skin is warm and dry.  Neurological:     General: No focal deficit present.     Mental Status: He is alert and oriented to person, place, and time.     Cranial Nerves: No cranial nerve deficit.  Psychiatric:        Mood and Affect: Mood normal.        Behavior: Behavior normal.           Assessment & Plan:    "

## 2024-04-28 NOTE — Patient Instructions (Signed)
 Follow up in 6 months to recheck cholesterol We'll notify you of your lab results and make any changes if needed Continue to work on healthy diet and regular exercise- you look great! Call with any questions or concerns Stay Safe!  Stay Healthy! CONGRATS ON THE MOVE!!!

## 2024-04-29 ENCOUNTER — Ambulatory Visit: Payer: Self-pay | Admitting: Family Medicine

## 2024-04-29 NOTE — Assessment & Plan Note (Signed)
 Check PSA for upcoming urology appt

## 2024-04-29 NOTE — Assessment & Plan Note (Signed)
 Chronic problem.  On Lipitor 10mg  daily w/o difficulty.  Check labs.  Adjust meds prn

## 2024-04-29 NOTE — Assessment & Plan Note (Signed)
 Has gained 8 lbs since last visit.  He continues to walk regularly but admits diet has not been great.  Check labs.  Will follow.

## 2024-05-20 ENCOUNTER — Encounter: Payer: Self-pay | Admitting: Family Medicine

## 2024-10-21 ENCOUNTER — Encounter

## 2024-10-27 ENCOUNTER — Ambulatory Visit: Admitting: Family Medicine
